# Patient Record
Sex: Male | Born: 1975 | Race: White | Hispanic: No | Marital: Single | Smoking: Current every day smoker
Health system: Southern US, Community
[De-identification: ages and names within clinical notes are randomized; demographics above are authoritative.]

## PROBLEM LIST (undated history)

## (undated) DIAGNOSIS — G709 Myoneural disorder, unspecified: Secondary | ICD-10-CM

## (undated) DIAGNOSIS — N189 Chronic kidney disease, unspecified: Secondary | ICD-10-CM

## (undated) HISTORY — PX: JOINT REPLACEMENT: SHX530

---

## 1998-09-15 ENCOUNTER — Ambulatory Visit (HOSPITAL_COMMUNITY): Admission: RE | Admit: 1998-09-15 | Discharge: 1998-09-15 | Payer: Self-pay | Admitting: Orthopedic Surgery

## 1998-09-15 ENCOUNTER — Encounter: Payer: Self-pay | Admitting: Orthopedic Surgery

## 1998-10-01 ENCOUNTER — Ambulatory Visit (HOSPITAL_BASED_OUTPATIENT_CLINIC_OR_DEPARTMENT_OTHER): Admission: RE | Admit: 1998-10-01 | Discharge: 1998-10-01 | Payer: Self-pay | Admitting: Orthopedic Surgery

## 1998-10-18 ENCOUNTER — Encounter: Admission: RE | Admit: 1998-10-18 | Discharge: 1998-11-04 | Payer: Self-pay | Admitting: Orthopedic Surgery

## 1999-11-10 ENCOUNTER — Emergency Department (HOSPITAL_COMMUNITY): Admission: EM | Admit: 1999-11-10 | Discharge: 1999-11-10 | Payer: Self-pay | Admitting: Emergency Medicine

## 1999-11-10 ENCOUNTER — Encounter: Payer: Self-pay | Admitting: Emergency Medicine

## 2000-01-29 ENCOUNTER — Emergency Department (HOSPITAL_COMMUNITY): Admission: EM | Admit: 2000-01-29 | Discharge: 2000-01-29 | Payer: Self-pay | Admitting: Emergency Medicine

## 2000-05-25 ENCOUNTER — Emergency Department (HOSPITAL_COMMUNITY): Admission: EM | Admit: 2000-05-25 | Discharge: 2000-05-25 | Payer: Self-pay | Admitting: Emergency Medicine

## 2000-11-07 ENCOUNTER — Encounter: Payer: Self-pay | Admitting: Emergency Medicine

## 2000-11-07 ENCOUNTER — Emergency Department (HOSPITAL_COMMUNITY): Admission: EM | Admit: 2000-11-07 | Discharge: 2000-11-07 | Payer: Self-pay | Admitting: Emergency Medicine

## 2001-10-11 ENCOUNTER — Emergency Department (HOSPITAL_COMMUNITY): Admission: EM | Admit: 2001-10-11 | Discharge: 2001-10-12 | Payer: Self-pay | Admitting: Emergency Medicine

## 2001-10-11 ENCOUNTER — Encounter: Payer: Self-pay | Admitting: Emergency Medicine

## 2001-11-14 ENCOUNTER — Encounter: Payer: Self-pay | Admitting: Emergency Medicine

## 2001-11-14 ENCOUNTER — Emergency Department (HOSPITAL_COMMUNITY): Admission: EM | Admit: 2001-11-14 | Discharge: 2001-11-14 | Payer: Self-pay | Admitting: Emergency Medicine

## 2003-02-26 ENCOUNTER — Emergency Department (HOSPITAL_COMMUNITY): Admission: EM | Admit: 2003-02-26 | Discharge: 2003-02-26 | Payer: Self-pay | Admitting: Emergency Medicine

## 2004-08-24 ENCOUNTER — Emergency Department (HOSPITAL_COMMUNITY): Admission: EM | Admit: 2004-08-24 | Discharge: 2004-08-24 | Payer: Self-pay | Admitting: Emergency Medicine

## 2004-09-22 ENCOUNTER — Inpatient Hospital Stay (HOSPITAL_COMMUNITY): Admission: EM | Admit: 2004-09-22 | Discharge: 2004-09-25 | Payer: Self-pay | Admitting: Emergency Medicine

## 2005-05-18 ENCOUNTER — Emergency Department (HOSPITAL_COMMUNITY): Admission: EM | Admit: 2005-05-18 | Discharge: 2005-05-18 | Payer: Self-pay | Admitting: *Deleted

## 2005-05-21 ENCOUNTER — Emergency Department (HOSPITAL_COMMUNITY): Admission: EM | Admit: 2005-05-21 | Discharge: 2005-05-21 | Payer: Self-pay | Admitting: Emergency Medicine

## 2005-05-24 ENCOUNTER — Emergency Department (HOSPITAL_COMMUNITY): Admission: EM | Admit: 2005-05-24 | Discharge: 2005-05-24 | Payer: Self-pay | Admitting: Emergency Medicine

## 2011-02-05 ENCOUNTER — Emergency Department (HOSPITAL_COMMUNITY): Payer: Self-pay

## 2011-02-05 ENCOUNTER — Emergency Department (HOSPITAL_COMMUNITY)
Admission: EM | Admit: 2011-02-05 | Discharge: 2011-02-05 | Disposition: A | Discharge status: Home / Self Care | Payer: Self-pay | Attending: Emergency Medicine | Admitting: Emergency Medicine

## 2011-02-05 DIAGNOSIS — R109 Unspecified abdominal pain: Secondary | ICD-10-CM | POA: Insufficient documentation

## 2011-02-05 DIAGNOSIS — R110 Nausea: Secondary | ICD-10-CM | POA: Insufficient documentation

## 2011-02-05 LAB — URINALYSIS, ROUTINE W REFLEX MICROSCOPIC
Bilirubin Urine: NEGATIVE
Glucose, UA: NEGATIVE mg/dL
Hgb urine dipstick: NEGATIVE
Ketones, ur: 15 mg/dL — AB
Leukocytes, UA: NEGATIVE
Nitrite: NEGATIVE
Protein, ur: NEGATIVE mg/dL
Urobilinogen, UA: 1 mg/dL (ref 0.0–1.0)

## 2011-03-20 ENCOUNTER — Emergency Department (HOSPITAL_COMMUNITY): Payer: Self-pay

## 2011-03-20 ENCOUNTER — Emergency Department (HOSPITAL_COMMUNITY)
Admission: EM | Admit: 2011-03-20 | Discharge: 2011-03-20 | Disposition: A | Discharge status: Home / Self Care | Payer: Self-pay | Attending: Emergency Medicine | Admitting: Emergency Medicine

## 2011-03-20 DIAGNOSIS — M545 Low back pain, unspecified: Secondary | ICD-10-CM | POA: Insufficient documentation

## 2011-03-20 DIAGNOSIS — R6884 Jaw pain: Secondary | ICD-10-CM | POA: Insufficient documentation

## 2011-03-20 DIAGNOSIS — M542 Cervicalgia: Secondary | ICD-10-CM | POA: Insufficient documentation

## 2011-03-20 DIAGNOSIS — R112 Nausea with vomiting, unspecified: Secondary | ICD-10-CM | POA: Insufficient documentation

## 2011-03-20 DIAGNOSIS — IMO0002 Reserved for concepts with insufficient information to code with codable children: Secondary | ICD-10-CM | POA: Insufficient documentation

## 2011-03-20 DIAGNOSIS — S139XXA Sprain of joints and ligaments of unspecified parts of neck, initial encounter: Secondary | ICD-10-CM | POA: Insufficient documentation

## 2011-03-20 DIAGNOSIS — R51 Headache: Secondary | ICD-10-CM | POA: Insufficient documentation

## 2011-03-20 DIAGNOSIS — S0010XA Contusion of unspecified eyelid and periocular area, initial encounter: Secondary | ICD-10-CM | POA: Insufficient documentation

## 2011-03-20 DIAGNOSIS — S0990XA Unspecified injury of head, initial encounter: Secondary | ICD-10-CM | POA: Insufficient documentation

## 2011-03-20 DIAGNOSIS — Z23 Encounter for immunization: Secondary | ICD-10-CM | POA: Insufficient documentation

## 2012-02-12 ENCOUNTER — Emergency Department (HOSPITAL_BASED_OUTPATIENT_CLINIC_OR_DEPARTMENT_OTHER): Payer: Self-pay

## 2012-02-12 ENCOUNTER — Inpatient Hospital Stay (HOSPITAL_BASED_OUTPATIENT_CLINIC_OR_DEPARTMENT_OTHER)
Admission: EM | Admit: 2012-02-12 | Discharge: 2012-02-13 | DRG: 683 | Disposition: A | Discharge status: Home / Self Care | Payer: Self-pay | Attending: Internal Medicine | Admitting: Internal Medicine

## 2012-02-12 ENCOUNTER — Encounter (HOSPITAL_BASED_OUTPATIENT_CLINIC_OR_DEPARTMENT_OTHER): Payer: Self-pay | Admitting: Emergency Medicine

## 2012-02-12 DIAGNOSIS — R7402 Elevation of levels of lactic acid dehydrogenase (LDH): Secondary | ICD-10-CM | POA: Diagnosis present

## 2012-02-12 DIAGNOSIS — M6282 Rhabdomyolysis: Secondary | ICD-10-CM | POA: Diagnosis present

## 2012-02-12 DIAGNOSIS — N179 Acute kidney failure, unspecified: Principal | ICD-10-CM | POA: Diagnosis present

## 2012-02-12 DIAGNOSIS — E871 Hypo-osmolality and hyponatremia: Secondary | ICD-10-CM | POA: Diagnosis present

## 2012-02-12 DIAGNOSIS — E872 Acidosis: Secondary | ICD-10-CM

## 2012-02-12 DIAGNOSIS — R251 Tremor, unspecified: Secondary | ICD-10-CM

## 2012-02-12 DIAGNOSIS — R259 Unspecified abnormal involuntary movements: Secondary | ICD-10-CM | POA: Diagnosis present

## 2012-02-12 DIAGNOSIS — R7401 Elevation of levels of liver transaminase levels: Secondary | ICD-10-CM | POA: Diagnosis present

## 2012-02-12 HISTORY — DX: Myoneural disorder, unspecified: G70.9

## 2012-02-12 HISTORY — DX: Chronic kidney disease, unspecified: N18.9

## 2012-02-12 LAB — URINE MICROSCOPIC-ADD ON

## 2012-02-12 LAB — CBC WITH DIFFERENTIAL/PLATELET
Basophils Absolute: 0 10*3/uL (ref 0.0–0.1)
Basophils Relative: 0 % (ref 0–1)
Eosinophils Relative: 0 % (ref 0–5)
HCT: 43.6 % (ref 39.0–52.0)
Hemoglobin: 16.4 g/dL (ref 13.0–17.0)
Lymphs Abs: 1 10*3/uL (ref 0.7–4.0)
MCH: 32.7 pg (ref 26.0–34.0)
MCV: 86.9 fL (ref 78.0–100.0)
Monocytes Absolute: 1.1 10*3/uL — ABNORMAL HIGH (ref 0.1–1.0)
Monocytes Relative: 11 % (ref 3–12)
Neutro Abs: 7.9 10*3/uL — ABNORMAL HIGH (ref 1.7–7.7)
Neutrophils Relative %: 79 % — ABNORMAL HIGH (ref 43–77)
Platelets: 369 10*3/uL (ref 150–400)
RBC: 5.02 MIL/uL (ref 4.22–5.81)
RDW: 13.7 % (ref 11.5–15.5)
WBC: 10 10*3/uL (ref 4.0–10.5)

## 2012-02-12 LAB — COMPREHENSIVE METABOLIC PANEL
ALT: 30 U/L (ref 0–53)
AST: 28 U/L (ref 0–37)
Alkaline Phosphatase: 69 U/L (ref 39–117)
CO2: 19 mEq/L (ref 19–32)
Chloride: 79 mEq/L — ABNORMAL LOW (ref 96–112)
Creatinine, Ser: 7.1 mg/dL — ABNORMAL HIGH (ref 0.50–1.35)
GFR calc Af Amer: 10 mL/min — ABNORMAL LOW (ref >90)
GFR calc non Af Amer: 9 mL/min — ABNORMAL LOW (ref >90)
Glucose, Bld: 125 mg/dL — ABNORMAL HIGH (ref 70–99)
Potassium: 4 mEq/L (ref 3.5–5.1)
Sodium: 125 mEq/L — ABNORMAL LOW (ref 135–145)
Total Bilirubin: 0.5 mg/dL (ref 0.3–1.2)

## 2012-02-12 LAB — CREATININE, URINE, RANDOM: Creatinine, Urine: 182.45 mg/dL

## 2012-02-12 LAB — URINALYSIS, ROUTINE W REFLEX MICROSCOPIC
Bilirubin Urine: NEGATIVE
Glucose, UA: 250 mg/dL — AB
Ketones, ur: NEGATIVE mg/dL
Nitrite: NEGATIVE
Protein, ur: 30 mg/dL — AB
Specific Gravity, Urine: 1.017 (ref 1.005–1.030)

## 2012-02-12 LAB — MAGNESIUM: Magnesium: 2.4 mg/dL (ref 1.5–2.5)

## 2012-02-12 LAB — PHOSPHORUS: Phosphorus: 6.7 mg/dL — ABNORMAL HIGH (ref 2.3–4.6)

## 2012-02-12 LAB — LIPASE, BLOOD: Lipase: 101 U/L — ABNORMAL HIGH (ref 11–59)

## 2012-02-12 LAB — LACTIC ACID, PLASMA: Lactic Acid, Venous: 1 mmol/L (ref 0.5–2.2)

## 2012-02-12 MED ORDER — ACETAMINOPHEN 325 MG PO TABS: 650.0000 mg | ORAL_TABLET | Freq: Four times a day (QID) | ORAL | Status: DC | PRN

## 2012-02-12 MED ORDER — ONDANSETRON HCL 4 MG PO TABS: 4.0000 mg | ORAL_TABLET | Freq: Four times a day (QID) | ORAL | Status: DC | PRN

## 2012-02-12 MED ORDER — HYDROMORPHONE HCL PF 1 MG/ML IJ SOLN
1.0000 mg | Freq: Once | INTRAMUSCULAR | Status: AC
  Administered 2012-02-12: 1 mg via INTRAVENOUS

## 2012-02-12 MED ORDER — SODIUM BICARBONATE 8.4 % IV SOLN
INTRAVENOUS | Status: DC
  Administered 2012-02-12 – 2012-02-13 (×2): via INTRAVENOUS
  Filled 2012-02-12 (×4): qty 150

## 2012-02-12 MED ORDER — SODIUM CHLORIDE 0.9 % IV BOLUS (SEPSIS)
1000.0000 mL | Freq: Once | INTRAVENOUS | Status: AC
  Administered 2012-02-12 (×2): 1000 mL via INTRAVENOUS

## 2012-02-12 MED ORDER — KETOROLAC TROMETHAMINE 30 MG/ML IJ SOLN
30.0000 mg | Freq: Once | INTRAMUSCULAR | Status: AC
Start: 2012-02-12 — End: 2012-02-12
  Administered 2012-02-12: 30 mg via INTRAVENOUS

## 2012-02-12 MED ORDER — ACETAMINOPHEN 650 MG RE SUPP: 650.0000 mg | Freq: Four times a day (QID) | RECTAL | Status: DC | PRN

## 2012-02-12 MED ORDER — ONDANSETRON HCL 4 MG/2ML IJ SOLN
4.0000 mg | Freq: Once | INTRAMUSCULAR | Status: AC
  Administered 2012-02-12: 4 mg via INTRAVENOUS

## 2012-02-12 MED ORDER — VITAMIN B-1 100 MG PO TABS
100.0000 mg | ORAL_TABLET | Freq: Every day | ORAL | Status: DC
  Administered 2012-02-12 – 2012-02-13 (×2): 100 mg via ORAL
  Filled 2012-02-12 (×2): qty 1

## 2012-02-12 MED ORDER — NICOTINE 14 MG/24HR TD PT24
14.0000 mg | MEDICATED_PATCH | Freq: Every day | TRANSDERMAL | Status: DC
  Administered 2012-02-12 – 2012-02-13 (×2): 14 mg via TRANSDERMAL
  Filled 2012-02-12 (×2): qty 1

## 2012-02-12 MED ORDER — SODIUM CHLORIDE 0.9 % IJ SOLN
3.0000 mL | Freq: Two times a day (BID) | INTRAMUSCULAR | Status: DC
  Administered 2012-02-12: 3 mL via INTRAVENOUS

## 2012-02-12 MED ORDER — SODIUM CHLORIDE 0.9 % IV BOLUS (SEPSIS)
500.0000 mL | Freq: Once | INTRAVENOUS | Status: AC
  Administered 2012-02-12: 20:00:00 via INTRAVENOUS

## 2012-02-12 MED ORDER — PANTOPRAZOLE SODIUM 40 MG IV SOLR
40.0000 mg | Freq: Two times a day (BID) | INTRAVENOUS | Status: DC
  Administered 2012-02-12 – 2012-02-13 (×2): 40 mg via INTRAVENOUS
  Filled 2012-02-12 (×3): qty 40

## 2012-02-12 MED ORDER — ONDANSETRON HCL 4 MG/2ML IJ SOLN: 4.0000 mg | Freq: Four times a day (QID) | INTRAMUSCULAR | Status: DC | PRN

## 2012-02-12 MED ORDER — FOLIC ACID 1 MG PO TABS
1.0000 mg | ORAL_TABLET | Freq: Every day | ORAL | Status: DC
  Administered 2012-02-12 – 2012-02-13 (×2): 1 mg via ORAL
  Filled 2012-02-12 (×2): qty 1

## 2012-02-12 MED ORDER — SODIUM CHLORIDE 0.9 % IV SOLN: INTRAVENOUS | Status: DC

## 2012-02-12 MED ORDER — DOCUSATE SODIUM 100 MG PO CAPS
100.0000 mg | ORAL_CAPSULE | Freq: Two times a day (BID) | ORAL | Status: DC
  Administered 2012-02-12 – 2012-02-13 (×2): 100 mg via ORAL
  Filled 2012-02-12 (×3): qty 1

## 2012-02-12 MED ORDER — ONDANSETRON HCL 4 MG/2ML IJ SOLN: 4.0000 mg | Freq: Three times a day (TID) | INTRAMUSCULAR | Status: DC | PRN

## 2012-02-12 MED ORDER — HYDROMORPHONE HCL PF 1 MG/ML IJ SOLN
1.0000 mg | INTRAMUSCULAR | Status: DC | PRN
  Administered 2012-02-12 – 2012-02-13 (×5): 1 mg via INTRAVENOUS
  Filled 2012-02-12 (×5): qty 1

## 2012-02-12 MED ORDER — LORAZEPAM 0.5 MG PO TABS
0.5000 mg | ORAL_TABLET | Freq: Three times a day (TID) | ORAL | Status: DC | PRN
  Administered 2012-02-12 – 2012-02-13 (×2): 0.5 mg via ORAL
  Filled 2012-02-12 (×2): qty 1

## 2012-02-12 MED ORDER — HYDROMORPHONE HCL PF 1 MG/ML IJ SOLN: 1.0000 mg | INTRAMUSCULAR | Status: DC | PRN

## 2012-02-12 MED ORDER — HYDROCODONE-ACETAMINOPHEN 5-325 MG PO TABS
1.0000 | ORAL_TABLET | ORAL | Status: DC | PRN
  Administered 2012-02-13: 2 via ORAL
  Filled 2012-02-12: qty 2

## 2012-02-12 NOTE — ED Notes (Signed)
Pt is unable to void at present time. 

## 2012-02-12 NOTE — ED Notes (Signed)
[facility] has been called for transport to room 5506 at [facility].

## 2012-02-12 NOTE — ED Notes (Signed)
Had [facility] to repage

## 2012-02-12 NOTE — ED Provider Notes (Signed)
History     CSN: [id number]  Arrival date & time 02/12/12  1248   First MD Initiated Contact with Patient 02/12/12 1248      Chief Complaint  Patient presents with  . Weakness  . Nausea     HPI The patient presents with one week of complaints.  He notes that prior to one week ago he was in his usual state of health aside from a recent episode of viral illness.  One week ago the patient was working outside, in the sun, and a particularly hot day.  He notes that following a that he has had persistent fatigue, generalized discomfort, and decreased appetite.  Over the past 2 days the symptoms have become worse, with new nausea as well.  The patient notes no clear alleviating or exacerbating factors, with no improvement following days of rest.  He denies any concurrent disorientation, discoordination, but does say he is lightheaded. He denies any focal chest pain, dyspnea. History reviewed. No pertinent past medical history.  History reviewed. No pertinent past surgical history.  No family history on file.  History  Substance Use Topics  . Smoking status: Current Everyday Smoker -- 1.0 packs/day  . Smokeless tobacco: Not on file  . Alcohol Use: Yes      Review of Systems  Constitutional:       Per HPI, otherwise negative  HENT:       Per HPI, otherwise negative  Eyes: Negative.   Respiratory:       Per HPI, otherwise negative  Cardiovascular:       Per HPI, otherwise negative  Gastrointestinal: Positive for nausea. Negative for vomiting.  Genitourinary: Negative.   Musculoskeletal:       Per HPI, otherwise negative  Skin: Negative.   Neurological: Positive for light-headedness. Negative for syncope.    Allergies  Phenergan  Home Medications  No current outpatient prescriptions on file.  BP 105/77  Pulse 109  Temp 98.4 F (36.9 C) (Oral)  Ht 5\' 10"  (1.778 m)  Wt 165 lb (74.844 kg)  BMI 23.68 kg/m2  SpO2 99%  Physical Exam  Nursing note and vitals  reviewed. Constitutional: He is oriented to person, place, and time. He appears well-developed. No distress.  HENT:  Head: Normocephalic and atraumatic.  Eyes: Conjunctivae and EOM are normal.  Cardiovascular: Regular rhythm.  Tachycardia present.   Pulmonary/Chest: Effort normal. No stridor. No respiratory distress.  Abdominal: He exhibits no distension.  Musculoskeletal: He exhibits no edema.  Neurological: He is alert and oriented to person, place, and time.  Skin: Skin is warm and dry.  Psychiatric: He has a normal mood and affect.    ED Course  Procedures (including critical care time)   Labs Reviewed  LACTIC ACID, PLASMA  CBC WITH DIFFERENTIAL  LIPASE, BLOOD  URINALYSIS, ROUTINE W REFLEX MICROSCOPIC  COMPREHENSIVE METABOLIC PANEL  CK   No results found.   No diagnosis found.   Pulse ox 99% room air normal  3:38 PM The patient was informed of all results.  He notes that he feels slightly better with IV fluids. I discussed the case with both nephrology and the hospitalist service MDM  This previously well 34 old male presents with new generalized complaints, nausea.  On the patient is listless appearing, though he is mentating appropriately he appears unwell.  The patient is tachycardic.  Given the patient's description of a week of symptoms there's concern for acute viral episode versus dehydration, versus renal issues.  The  patient's labs are notable for dentures and creatinine of 7.  The patient has other notable abnormalities.  Given these findings, the patient was transferred to [facility] for additional evaluation and management.  The patient continued to receive IV fluids, analgesics at [facility].  He was transferred in stable but guarded condition.    Gerhard Munch, MD 02/13/12 1218
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
## 2012-02-12 NOTE — ED Notes (Signed)
Via [facility]--spoke with Feliz Beam to page both Nephrology and Hospitalist

## 2012-02-13 ENCOUNTER — Encounter (HOSPITAL_COMMUNITY): Payer: Self-pay | Admitting: *Deleted

## 2012-02-13 ENCOUNTER — Inpatient Hospital Stay (HOSPITAL_COMMUNITY): Payer: Self-pay

## 2012-02-13 DIAGNOSIS — M6282 Rhabdomyolysis: Secondary | ICD-10-CM

## 2012-02-13 DIAGNOSIS — E872 Acidosis: Secondary | ICD-10-CM | POA: Diagnosis present

## 2012-02-13 DIAGNOSIS — N179 Acute kidney failure, unspecified: Principal | ICD-10-CM

## 2012-02-13 DIAGNOSIS — E871 Hypo-osmolality and hyponatremia: Secondary | ICD-10-CM

## 2012-02-13 LAB — RENAL FUNCTION PANEL
Albumin: 3.5 g/dL (ref 3.5–5.2)
CO2: 27 mEq/L (ref 19–32)
Chloride: 88 mEq/L — ABNORMAL LOW (ref 96–112)
GFR calc Af Amer: 32 mL/min — ABNORMAL LOW (ref >90)
GFR calc non Af Amer: 27 mL/min — ABNORMAL LOW (ref >90)
Potassium: 2.7 mEq/L — CL (ref 3.5–5.1)
Sodium: 130 mEq/L — ABNORMAL LOW (ref 135–145)

## 2012-02-13 LAB — BASIC METABOLIC PANEL
CO2: 26 mEq/L (ref 19–32)
Glucose, Bld: 133 mg/dL — ABNORMAL HIGH (ref 70–99)
Potassium: 4.7 mEq/L (ref 3.5–5.1)
Sodium: 136 mEq/L (ref 135–145)

## 2012-02-13 LAB — SODIUM, URINE, RANDOM: Sodium, Ur: <10 mEq/L

## 2012-02-13 LAB — CBC
MCV: 89.2 fL (ref 78.0–100.0)
Platelets: 265 10*3/uL (ref 150–400)
RBC: 3.9 MIL/uL — ABNORMAL LOW (ref 4.22–5.81)
WBC: 5.2 10*3/uL (ref 4.0–10.5)

## 2012-02-13 LAB — OSMOLALITY, URINE: Osmolality, Ur: 358 mOsm/kg — ABNORMAL LOW (ref 390–1090)

## 2012-02-13 LAB — HIV ANTIBODY (ROUTINE TESTING W REFLEX): HIV: NONREACTIVE

## 2012-02-13 LAB — TSH: TSH: 0.781 u[IU]/mL (ref 0.350–4.500)

## 2012-02-13 MED ORDER — SODIUM CHLORIDE 0.9 % IV SOLN
INTRAVENOUS | Status: DC
  Administered 2012-02-13: 125 mL via INTRAVENOUS

## 2012-02-13 MED ORDER — HYDROCODONE-ACETAMINOPHEN 5-325 MG PO TABS: 1.0000 | ORAL_TABLET | ORAL | Status: AC | PRN

## 2012-02-13 MED ORDER — NICOTINE 21 MG/24HR TD PT24: 1.0000 | MEDICATED_PATCH | TRANSDERMAL | Status: AC

## 2012-02-13 MED ORDER — SODIUM CHLORIDE 0.9 % IV BOLUS (SEPSIS)
500.0000 mL | Freq: Once | INTRAVENOUS | Status: AC
  Administered 2012-02-13: 500 mL via INTRAVENOUS

## 2012-02-13 MED ORDER — POTASSIUM CHLORIDE 10 MEQ/100ML IV SOLN
10.0000 meq | INTRAVENOUS | Status: DC
  Filled 2012-02-13 (×4): qty 100

## 2012-02-13 MED ORDER — POTASSIUM CHLORIDE CRYS ER 20 MEQ PO TBCR
40.0000 meq | EXTENDED_RELEASE_TABLET | Freq: Three times a day (TID) | ORAL | Status: DC
  Administered 2012-02-13 (×2): 40 meq via ORAL
  Filled 2012-02-13 (×2): qty 2

## 2012-02-13 MED ORDER — POTASSIUM CHLORIDE 10 MEQ/100ML IV SOLN
10.0000 meq | INTRAVENOUS | Status: AC
  Administered 2012-02-13 (×4): 10 meq via INTRAVENOUS
  Filled 2012-02-13 (×4): qty 100

## 2012-02-13 NOTE — Progress Notes (Signed)
Harold Whitney to be D/C'd Home per MD order.  Discussed with the patient and all questions fully answered.   Romario, Tith  Home Medication Instructions ZOX:[id number]   Printed on:02/13/12 1800  Medication Information                    HYDROcodone-acetaminophen (NORCO/VICODIN) 5-325 MG per tablet Take 1-2 tablets by mouth every 4 (four) hours as needed.           nicotine (NICODERM CQ - DOSED IN MG/24 HOURS) 21 mg/24hr patch Place 1 patch onto the skin daily.             VVS, Skin clean, dry and intact without evidence of skin break down, no evidence of skin tears noted. IV catheter discontinued intact. Site without signs and symptoms of complications. Dressing and pressure applied.  An After Visit Summary was printed and given to the patient. Follow up appointments , new prescriptions and medication administration times given. Note given for work. Pt education handouts included hypokalemia, dehydration and rehydration and smoking cessation handouts and information on the Quit line. Patient  D/C home via private auto.  Cindra Eves, RN 02/13/2012 6:00 PM

## 2012-02-13 NOTE — Discharge Summary (Signed)
Physician Discharge Summary  Harold Whitney NFA:[id number] DOB: Apr 04, 1976 DOA: 02/12/2012  PCP: No primary provider on file.  Admit date: 02/12/2012 Discharge date: 02/13/2012  Discharge Diagnoses:  Active Problems:  Acute renal failure  Occasional tremors  Hyponatremia  Rhabdomyolysis   Discharge Condition: Stable for discharge  Disposition:  Followup with his primary care doctor and to 4 weeks. Next week we'll try to get a basic metabolic panel sodium is kidneys are doing.  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 He relates that 3 weeks ago he was feeling sick, he had fever 101, he slept all day. That resolved. Then 1 week ago, he was out site working, in the sun, he felt tired, fatigue. He had lower extremities and abdominal cramps that started 2 days prior to admission. He also relates multiple episodes of vomiting fluids content day prior to admission. He has been taking ibuprofen for muscle pain and cramps for last couple of days. 2 tablets every 4 hour. He had 1 episode of watery BM 3 days prior to admission. He drinks alcohol but he can stop drinking without problems. He has not been able to urinate since day prior to admission.   Hospital Course:  Acute renal failure: His acute renal failure is much likely multifactorial secondary to decreased intravascular volume secondary to dehydration, and said use and mild rhabdomyolysis. He was admitted to the hospital was started on IV fluids and bicarbonate drip. His creatinine dramatically changed when it initially when he came into the hospital it was 7 at the time of discharge was 2.1. So his IV fluids were changed to normal saline. And he would continue and has been advised to continue to drink plenty of fluid at home. Avoid NSAIDs.  Hyponatremia -Days mostly  secondary to decreased intravascular volume does resolve with IV fluids.  Rhabdomyolysis -Most likely secondary to dehydration. His peak CK was 591, after hydration came down to 300s. He continued oral fluid repletion as an outpatient.   Elevated liver enzymes: This most likely secondary to his rhabdomyolysis. He has no jaundice his bilirubin is on normal limits and his alkaline phosphatase is also within normal limits.  Discharge Exam: Filed Vitals:   02/13/12 0542  BP: 124/81  Pulse: 71  Temp: 97.8 F (36.6 C)  Resp: 20   Filed Vitals:   02/12/12 1612 02/12/12 1700 02/12/12 2201 02/13/12 0542  BP:  121/70 134/77 124/81  Pulse:  87 76 71  Temp:  98 F (36.7 C) 98.5 F (36.9 C) 97.8 F (36.6 C)  TempSrc:  Oral Oral Oral  Resp: 20 20 18 20   Height:      Weight:    75.2 kg (165 lb 12.6 oz)  SpO2:  97% 95% 96%   General: Awake alert and oriented x3 Cardiovascular: Regular in rhythm with positive S1 and S2 Respiratory: Good air movement and clear to auscultate  Discharge Instructions  Discharge Orders    Future Orders Please Complete By Expires   Diet - low sodium heart healthy      Increase activity slowly        Medication List    Notice       You have not been prescribed any medications.               The results of significant diagnostics from this  hospitalization (including imaging, microbiology, ancillary and laboratory) are listed below for reference.    Significant Diagnostic Studies: Dg Chest 2 View  02/12/2012  *RADIOLOGY REPORT*  Clinical Data: Discomfort. Weakness.  CHEST - 2 VIEW  Comparison: 05/21/2005  Findings: The heart size and mediastinal contours are within normal limits.  Both lungs are clear.  The visualized skeletal structures are unremarkable.  IMPRESSION: Negative exam.  Original Report Authenticated By: Rosealee Albee, M.D.    Microbiology: No results found for this or any previous visit (from the past 240 hour(s)).    Labs: Basic Metabolic Panel:  Lab 02/13/12 1610 02/12/12 2008 02/12/12 1400  NA 130* -- 125*  K 2.7* -- 4.0  CL 88* -- 79*  CO2 27 -- 19  GLUCOSE 121* -- 125*  BUN 44* -- 63*  CREATININE 2.82* -- 7.10*  CALCIUM 8.1* -- 9.7  MG -- 2.4 --  PHOS 4.2 6.7* --   Liver Function Tests:  Lab 02/13/12 0520 02/12/12 1400  AST -- 28  ALT -- 30  ALKPHOS -- 69  BILITOT -- 0.5  PROT -- 9.2*  ALBUMIN 3.5 5.4*    Lab 02/13/12 0520 02/12/12 1400  LIPASE 61* 101*  AMYLASE -- --   No results found for this basename: AMMONIA:5 in the last 168 hours CBC:  Lab 02/13/12 0520 02/12/12 1400  WBC 5.2 10.0  NEUTROABS -- 7.9*  HGB 12.5* 16.4  HCT 34.8* 43.6  MCV 89.2 86.9  PLT 265 369   Cardiac Enzymes:  Lab 02/13/12 0520 02/12/12 1400  CKTOTAL 383* 591*  CKMB -- --  CKMBINDEX -- --  TROPONINI -- --   BNP: No components found with this basename: POCBNP:5 CBG: No results found for this basename: GLUCAP:5 in the last 168 hours  Time coordinating discharge: Greater than 35 minutes  Signed:  Marinda Elk  [facility] 02/13/2012, 10:23 AM

## 2012-02-13 NOTE — Progress Notes (Signed)
CRITICAL VALUE ALERT  Critical value received: Potassium 2.7  Date of notification:  02/13/12  Time of notification:  0645  Critical value read back:yes  Nurse who received alert:  Nelda Marseille, RN  MD notified (1st page):Callahan, NP  Time of first page: [phone]  MD notified (2nd page):  Time of second page:  Responding MD:  Claiborne Billings, NP  Time MD responded:  [phone] will put in order for potassium IV runs

## 2012-02-15 MED FILL — Hydromorphone HCl Preservative Free (PF) Inj 2 MG/ML: INTRAMUSCULAR | Qty: 1 | Status: AC

## 2012-02-16 LAB — PROTEIN ELECTROPHORESIS, SERUM
Alpha-2-Globulin: 12.1 % — ABNORMAL HIGH (ref 7.1–11.8)
Beta 2: 3.8 % (ref 3.2–6.5)
Beta Globulin: 6.1 % (ref 4.7–7.2)
M-Spike, %: NOT DETECTED g/dL

## 2012-02-16 LAB — UIFE/LIGHT CHAINS/TP QN, 24-HR UR
Albumin, U: DETECTED
Alpha 1, Urine: DETECTED — AB
Alpha 2, Urine: DETECTED — AB
Free Kappa Lt Chains,Ur: 48.3 mg/dL — ABNORMAL HIGH (ref 0.14–2.42)
Free Kappa/Lambda Ratio: 4.47 ratio (ref 2.04–10.37)
Gamma Globulin, Urine: DETECTED — AB

## 2012-03-21 ENCOUNTER — Encounter (HOSPITAL_COMMUNITY): Payer: Self-pay | Admitting: Family Medicine

## 2012-03-21 ENCOUNTER — Emergency Department (HOSPITAL_COMMUNITY)
Admission: EM | Admit: 2012-03-21 | Discharge: 2012-03-21 | Disposition: A | Discharge status: Home Health | Payer: Self-pay | Attending: Emergency Medicine | Admitting: Emergency Medicine

## 2012-03-21 DIAGNOSIS — N189 Chronic kidney disease, unspecified: Secondary | ICD-10-CM | POA: Insufficient documentation

## 2012-03-21 DIAGNOSIS — G8918 Other acute postprocedural pain: Secondary | ICD-10-CM | POA: Insufficient documentation

## 2012-03-21 DIAGNOSIS — F17200 Nicotine dependence, unspecified, uncomplicated: Secondary | ICD-10-CM | POA: Insufficient documentation

## 2012-03-21 MED ORDER — OXYCODONE-ACETAMINOPHEN 5-325 MG PO TABS
2.0000 | ORAL_TABLET | Freq: Once | ORAL | Status: AC
  Administered 2012-03-21: 2 via ORAL
  Filled 2012-03-21: qty 2

## 2012-03-21 MED ORDER — OXYCODONE-ACETAMINOPHEN 5-325 MG PO TABS: 1.0000 | ORAL_TABLET | ORAL | Status: AC | PRN

## 2012-03-21 NOTE — ED Provider Notes (Signed)
History/physical exam/procedure(s) were performed by non-physician practitioner and as supervising physician I was immediately available for consultation/collaboration. I have reviewed all notes and am in agreement with care and plan.   Hilario Quarry, MD 03/21/12 2004
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
## 2013-08-26 ENCOUNTER — Emergency Department (HOSPITAL_COMMUNITY): Payer: Self-pay

## 2013-08-26 ENCOUNTER — Emergency Department (HOSPITAL_COMMUNITY)
Admission: EM | Admit: 2013-08-26 | Discharge: 2013-08-26 | Disposition: A | Discharge status: Home / Self Care | Payer: Self-pay | Attending: Emergency Medicine | Admitting: Emergency Medicine

## 2013-08-26 DIAGNOSIS — N189 Chronic kidney disease, unspecified: Secondary | ICD-10-CM | POA: Insufficient documentation

## 2013-08-26 DIAGNOSIS — M549 Dorsalgia, unspecified: Secondary | ICD-10-CM

## 2013-08-26 DIAGNOSIS — Z8669 Personal history of other diseases of the nervous system and sense organs: Secondary | ICD-10-CM | POA: Insufficient documentation

## 2013-08-26 DIAGNOSIS — K5289 Other specified noninfective gastroenteritis and colitis: Secondary | ICD-10-CM | POA: Insufficient documentation

## 2013-08-26 DIAGNOSIS — F17200 Nicotine dependence, unspecified, uncomplicated: Secondary | ICD-10-CM | POA: Insufficient documentation

## 2013-08-26 DIAGNOSIS — K219 Gastro-esophageal reflux disease without esophagitis: Secondary | ICD-10-CM

## 2013-08-26 DIAGNOSIS — K529 Noninfective gastroenteritis and colitis, unspecified: Secondary | ICD-10-CM

## 2013-08-26 DIAGNOSIS — R630 Anorexia: Secondary | ICD-10-CM | POA: Insufficient documentation

## 2013-08-26 DIAGNOSIS — R197 Diarrhea, unspecified: Secondary | ICD-10-CM

## 2013-08-26 LAB — CBC WITH DIFFERENTIAL/PLATELET
BASOS ABS: 0 10*3/uL (ref 0.0–0.1)
BASOS PCT: 0 % (ref 0–1)
Eosinophils Absolute: 0.2 10*3/uL (ref 0.0–0.7)
Eosinophils Relative: 3 % (ref 0–5)
HEMATOCRIT: 37.2 % — AB (ref 39.0–52.0)
Hemoglobin: 13.1 g/dL (ref 13.0–17.0)
Lymphocytes Relative: 25 % (ref 12–46)
Lymphs Abs: 1.6 10*3/uL (ref 0.7–4.0)
MCH: 32.1 pg (ref 26.0–34.0)
MCHC: 35.2 g/dL (ref 30.0–36.0)
MCV: 91.2 fL (ref 78.0–100.0)
MONO ABS: 0.4 10*3/uL (ref 0.1–1.0)
Monocytes Relative: 7 % (ref 3–12)
NEUTROS ABS: 4.2 10*3/uL (ref 1.7–7.7)
Neutrophils Relative %: 66 % (ref 43–77)
Platelets: 275 10*3/uL (ref 150–400)
RBC: 4.08 MIL/uL — ABNORMAL LOW (ref 4.22–5.81)
RDW: 12.7 % (ref 11.5–15.5)
WBC: 6.4 10*3/uL (ref 4.0–10.5)

## 2013-08-26 LAB — URINALYSIS, ROUTINE W REFLEX MICROSCOPIC
Bilirubin Urine: NEGATIVE
GLUCOSE, UA: NEGATIVE mg/dL
Ketones, ur: NEGATIVE mg/dL
Leukocytes, UA: NEGATIVE
Nitrite: NEGATIVE
PH: 5.5 (ref 5.0–8.0)
Protein, ur: NEGATIVE mg/dL
SPECIFIC GRAVITY, URINE: 1.026 (ref 1.005–1.030)
Urobilinogen, UA: 0.2 mg/dL (ref 0.0–1.0)

## 2013-08-26 LAB — COMPREHENSIVE METABOLIC PANEL
ALBUMIN: 3.8 g/dL (ref 3.5–5.2)
ALT: 12 U/L (ref 0–53)
AST: 17 U/L (ref 0–37)
Alkaline Phosphatase: 71 U/L (ref 39–117)
BUN: 13 mg/dL (ref 6–23)
CALCIUM: 8.9 mg/dL (ref 8.4–10.5)
CHLORIDE: 101 meq/L (ref 96–112)
CO2: 27 mEq/L (ref 19–32)
CREATININE: 0.91 mg/dL (ref 0.50–1.35)
GFR calc Af Amer: >90 mL/min (ref >90)
GFR calc non Af Amer: >90 mL/min (ref >90)
Glucose, Bld: 103 mg/dL — ABNORMAL HIGH (ref 70–99)
Potassium: 3.8 mEq/L (ref 3.7–5.3)
Sodium: 142 mEq/L (ref 137–147)
Total Bilirubin: 0.3 mg/dL (ref 0.3–1.2)
Total Protein: 6.7 g/dL (ref 6.0–8.3)

## 2013-08-26 LAB — URINE MICROSCOPIC-ADD ON

## 2013-08-26 LAB — LIPASE, BLOOD: LIPASE: 29 U/L (ref 11–59)

## 2013-08-26 MED ORDER — SODIUM CHLORIDE 0.9 % IV BOLUS (SEPSIS)
1000.0000 mL | Freq: Once | INTRAVENOUS | Status: AC
  Administered 2013-08-26: 1000 mL via INTRAVENOUS

## 2013-08-26 MED ORDER — ONDANSETRON 4 MG PO TBDP: 4.0000 mg | ORAL_TABLET | Freq: Three times a day (TID) | ORAL | Status: DC | PRN

## 2013-08-26 MED ORDER — MORPHINE SULFATE 4 MG/ML IJ SOLN
4.0000 mg | Freq: Once | INTRAMUSCULAR | Status: AC
  Administered 2013-08-26: 4 mg via INTRAVENOUS
  Filled 2013-08-26: qty 1

## 2013-08-26 MED ORDER — IOHEXOL 300 MG/ML  SOLN
25.0000 mL | INTRAMUSCULAR | Status: DC | PRN
  Administered 2013-08-26: 25 mL via ORAL

## 2013-08-26 MED ORDER — OMEPRAZOLE 20 MG PO CPDR: 20.0000 mg | DELAYED_RELEASE_CAPSULE | Freq: Every day | ORAL | Status: DC

## 2013-08-26 MED ORDER — METRONIDAZOLE 500 MG PO TABS: 500.0000 mg | ORAL_TABLET | Freq: Three times a day (TID) | ORAL | Status: DC

## 2013-08-26 MED ORDER — IOHEXOL 300 MG/ML  SOLN
100.0000 mL | Freq: Once | INTRAMUSCULAR | Status: AC | PRN
  Administered 2013-08-26: 100 mL via INTRAVENOUS

## 2013-08-26 MED ORDER — ONDANSETRON HCL 4 MG/2ML IJ SOLN
4.0000 mg | Freq: Once | INTRAMUSCULAR | Status: AC
  Administered 2013-08-26: 4 mg via INTRAVENOUS
  Filled 2013-08-26: qty 2

## 2013-08-26 MED ORDER — OXYCODONE-ACETAMINOPHEN 5-325 MG PO TABS: 1.0000 | ORAL_TABLET | Freq: Four times a day (QID) | ORAL | Status: DC | PRN

## 2013-08-26 NOTE — ED Provider Notes (Signed)
Filed Vitals:   08/26/13 1715  BP: 156/99  Pulse: 66  Temp:   Resp: 18   Medications  iohexol (OMNIPAQUE) 300 MG/ML solution 25 mL (25 mLs Oral Contrast Given 08/26/13 1515)  sodium chloride 0.9 % bolus 1,000 mL (0 mLs Intravenous Stopped 08/26/13 1405)  ondansetron (ZOFRAN) injection 4 mg (4 mg Intravenous Given 08/26/13 1231)  morphine 4 MG/ML injection 4 mg (4 mg Intravenous Given 08/26/13 1252)  sodium chloride 0.9 % bolus 1,000 mL (0 mLs Intravenous Stopped 08/26/13 1539)  morphine 4 MG/ML injection 4 mg (4 mg Intravenous Given 08/26/13 1538)  iohexol (OMNIPAQUE) 300 MG/ML solution 100 mL (100 mLs Intravenous Contrast Given 08/26/13 1606)  morphine 4 MG/ML injection 4 mg (4 mg Intravenous Given 08/26/13 1717)   Labs Reviewed  CBC WITH DIFFERENTIAL - Abnormal; Notable for the following:    RBC 4.08 (*)    HCT 37.2 (*)    All other components within normal limits  COMPREHENSIVE METABOLIC PANEL - Abnormal; Notable for the following:    Glucose, Bld 103 (*)    All other components within normal limits  URINALYSIS, ROUTINE W REFLEX MICROSCOPIC - Abnormal; Notable for the following:    Hgb urine dipstick TRACE (*)    All other components within normal limits  URINE MICROSCOPIC-ADD ON - Abnormal; Notable for the following:    Casts HYALINE CASTS (*)    All other components within normal limits  LIPASE, BLOOD   Results for orders placed during the hospital encounter of 08/26/13  CBC WITH DIFFERENTIAL      Result Value Range   WBC 6.4  4.0 - 10.5 K/uL   RBC 4.08 (*) 4.22 - 5.81 MIL/uL   Hemoglobin 13.1  13.0 - 17.0 g/dL   HCT 78.2 (*) 95.6 - 21.3 %   MCV 91.2  78.0 - 100.0 fL   MCH 32.1  26.0 - 34.0 pg   MCHC 35.2  30.0 - 36.0 g/dL   RDW 08.6  57.8 - 46.9 %   Platelets 275  150 - 400 K/uL   Neutrophils Relative % 66  43 - 77 %   Neutro Abs 4.2  1.7 - 7.7 K/uL   Lymphocytes Relative 25  12 - 46 %   Lymphs Abs 1.6  0.7 - 4.0 K/uL   Monocytes Relative 7  3 - 12 %   Monocytes Absolute 0.4   0.1 - 1.0 K/uL   Eosinophils Relative 3  0 - 5 %   Eosinophils Absolute 0.2  0.0 - 0.7 K/uL   Basophils Relative 0  0 - 1 %   Basophils Absolute 0.0  0.0 - 0.1 K/uL  COMPREHENSIVE METABOLIC PANEL      Result Value Range   Sodium 142  137 - 147 mEq/L   Potassium 3.8  3.7 - 5.3 mEq/L   Chloride 101  96 - 112 mEq/L   CO2 27  19 - 32 mEq/L   Glucose, Bld 103 (*) 70 - 99 mg/dL   BUN 13  6 - 23 mg/dL   Creatinine, Ser 6.29  0.50 - 1.35 mg/dL   Calcium 8.9  8.4 - 52.8 mg/dL   Total Protein 6.7  6.0 - 8.3 g/dL   Albumin 3.8  3.5 - 5.2 g/dL   AST 17  0 - 37 U/L   ALT 12  0 - 53 U/L   Alkaline Phosphatase 71  39 - 117 U/L   Total Bilirubin 0.3  0.3 - 1.2 mg/dL  GFR calc non Af Amer >90  >90 mL/min   GFR calc Af Amer >90  >90 mL/min  URINALYSIS, ROUTINE W REFLEX MICROSCOPIC      Result Value Range   Color, Urine YELLOW  YELLOW   APPearance CLEAR  CLEAR   Specific Gravity, Urine 1.026  1.005 - 1.030   pH 5.5  5.0 - 8.0   Glucose, UA NEGATIVE  NEGATIVE mg/dL   Hgb urine dipstick TRACE (*) NEGATIVE   Bilirubin Urine NEGATIVE  NEGATIVE   Ketones, ur NEGATIVE  NEGATIVE mg/dL   Protein, ur NEGATIVE  NEGATIVE mg/dL   Urobilinogen, UA 0.2  0.0 - 1.0 mg/dL   Nitrite NEGATIVE  NEGATIVE   Leukocytes, UA NEGATIVE  NEGATIVE  LIPASE, BLOOD      Result Value Range   Lipase 29  11 - 59 U/L  URINE MICROSCOPIC-ADD ON      Result Value Range   Squamous Epithelial / LPF RARE  RARE   WBC, UA 0-2  <3 WBC/hpf   RBC / HPF 3-6  <3 RBC/hpf   Casts HYALINE CASTS (*) NEGATIVE   Ct Abdomen Pelvis W Contrast  08/26/2013   CLINICAL DATA:  Nausea with abdominal pain  EXAM: CT ABDOMEN AND PELVIS WITH CONTRAST  TECHNIQUE: Multidetector CT imaging of the abdomen and pelvis was performed using the standard protocol following bolus administration of intravenous contrast. Oral contrast was also administered.  CONTRAST:  OMNIPAQUE IOHEXOL 300 MG/ML  SOLN  COMPARISON:  February 05, 2011  FINDINGS: There is patchy  atelectasis in the lung bases. There is evidence of gastroesophageal reflux.  Liver is prominent, measuring 18.2 cm in length. No focal liver lesions are identified. There is no biliary duct dilatation. Gallbladder wall is not appreciably thickened.  Spleen, pancreas, and adrenals appear normal. Kidneys bilaterally show no mass or hydronephrosis on either side. There is no renal or ureteral calculus on either side.  In the pelvis, the urinary bladder is midline with normal wall thickness. There is no pelvic mass or fluid collection. Appendix appears normal.  There is some wall thickening in the descending colon without surrounding mesenteric thickening or abscess. There is no appreciable diverticular disease in this area.  There is no bowel obstruction.  No free air or portal venous air.  There is no ascites, adenopathy, or abscess in the abdomen or pelvis. Aorta is nonaneurysmal. There is degenerative change at L5-S1 with vacuum phenomenon and disc space narrowing. There is slight anterolisthesis of L5 on S1. 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 There are no blastic or lytic bone lesions.  IMPRESSION: There is mild wall thickening in the descending colon consistent with nonspecific colitis. There is no bowel obstruction. No abscess. Appendix appears normal. No renal or ureteral calculus. No hydronephrosis.  Liver is prominent but otherwise appears normal.  There is evidence of gastroesophageal reflux.  There is degenerative change at L5-S1 with mild spondylolisthesis at L5-S1. 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   Electronically Signed   By: Bretta Bang M.D.   On: 08/26/2013 16:26    Patient signed out to me by Irish Elders, NP with CT scan pending with likely plan to discharge patient home with symptomatic care. CT scan has resulted and shows nonspecific colitis w/o diverticular disease present, stable degenerative disc disease noted, GERD noted on scan as well. Scan otherwise unremarkable.  Pain improved over course of ED stay. Abdomen exam remained benign, mild tenderness still appreciated, but no guarding, rigidity,  absent bowel sounds, distention, or rebound tenderness on repeat abdominal examination. Will treat patient symptomatically for colitis including Flagyl given 1 week of symptoms to cover for possible bacterial source, GERD, and back pain. Advised PCP f/u. [facility] number was given. Return precautions discussed. Patient is agreeable to plan. Patient is stable at time of discharge. Patient d/w with Dr. Redgie GrayerMasneri, agrees with plan.       Jeannetta EllisJennifer L Trevelle Mcgurn, PA-C 08/26/13 2029

## 2013-08-26 NOTE — ED Notes (Signed)
Pt gowned.

## 2013-08-26 NOTE — Discharge Instructions (Signed)
Please follow up with your primary care physician in 1-2 days. If you do not have one please call the [facility] number listed above. Please follow up with the Gastroenterologist to schedule a follow up appointment. Please take your antibiotic until completion. Please take all medications as prescribed. Please take pain medication as prescribed and as needed for pain. Please do not drive on narcotic pain medication. Please read all discharge instructions and return precautions.   Colitis Colitis is inflammation of the colon. Colitis can be a short-term or long-standing (chronic) illness. Crohn's disease and ulcerative colitis are 2 types of colitis which are chronic. They usually require lifelong treatment. CAUSES  There are many different causes of colitis, including:  Viruses.  Germs (bacteria).  Medicine reactions. SYMPTOMS   Diarrhea.  Intestinal bleeding.  Pain.  Fever.  Throwing up (vomiting).  Tiredness (fatigue).  Weight loss.  Bowel blockage. DIAGNOSIS  The diagnosis of colitis is based on examination and stool or blood tests. X-rays, CT scan, and colonoscopy may also be needed. TREATMENT  Treatment may include:  Fluids given through the vein (intravenously).  Bowel rest (nothing to eat or drink for a period of time).  Medicine for pain and diarrhea.  Medicines (antibiotics) that kill germs.  Cortisone medicines.  Surgery. HOME CARE INSTRUCTIONS   Get plenty of rest.  Drink enough water and fluids to keep your urine clear or pale yellow.  Eat a well-balanced diet.  Call your caregiver for follow-up as recommended. SEEK IMMEDIATE MEDICAL CARE IF:   You develop chills.  You have an oral temperature above 102 F (38.9 C), not controlled by medicine.  You have extreme weakness, fainting, or dehydration.  You have repeated vomiting.  You develop severe belly (abdominal) pain or are passing bloody or tarry stools. MAKE SURE  YOU:   Understand these instructions.  Will watch your condition.  Will get help right away if you are not doing well or get worse. Document Released: 08/13/2004 Document Revised: 09/28/2011 Document Reviewed: 11/08/2009 [facility] Patient Information 2014 [place], [place].   Gastroesophageal Reflux Disease, Adult Gastroesophageal reflux disease (GERD) happens when acid from your stomach flows up into the esophagus. When acid comes in contact with the esophagus, the acid causes soreness (inflammation) in the esophagus. Over time, GERD may create small holes (ulcers) in the lining of the esophagus. CAUSES   Increased body weight. This puts pressure on the stomach, making acid rise from the stomach into the esophagus.  Smoking. This increases acid production in the stomach.  Drinking alcohol. This causes decreased pressure in the lower esophageal sphincter (valve or ring of muscle between the esophagus and stomach), allowing acid from the stomach into the esophagus.  Late evening meals and a full stomach. This increases pressure and acid production in the stomach.  A malformed lower esophageal sphincter. Sometimes, no cause is found. SYMPTOMS   Burning pain in the lower part of the mid-chest behind the breastbone and in the mid-stomach area. This may occur twice a week or more often.  Trouble swallowing.  Sore throat.  Dry cough.  Asthma-like symptoms including chest tightness, shortness of breath, or wheezing. DIAGNOSIS  Your caregiver may be able to diagnose GERD based on your symptoms. In some cases, X-rays and other tests may be done to check for complications or to check the condition of your stomach and esophagus. TREATMENT  Your caregiver may recommend over-the-counter or prescription medicines to help decrease acid production. Ask your caregiver before  starting or adding any new medicines.  HOME CARE INSTRUCTIONS   Change the factors that you can control. Ask your  caregiver for guidance concerning weight loss, quitting smoking, and alcohol consumption.  Avoid foods and drinks that make your symptoms worse, such as:  Caffeine or alcoholic drinks.  Chocolate.  Peppermint or mint flavorings.  Garlic and onions.  Spicy foods.  Citrus fruits, such as oranges, lemons, or limes.  Tomato-based foods such as sauce, chili, salsa, and pizza.  Fried and fatty foods.  Avoid lying down for the 3 hours prior to your bedtime or prior to taking a nap.  Eat small, frequent meals instead of large meals.  Wear loose-fitting clothing. Do not wear anything tight around your waist that causes pressure on your stomach.  Raise the head of your bed 6 to 8 inches with wood blocks to help you sleep. Extra pillows will not help.  Only take over-the-counter or prescription medicines for pain, discomfort, or fever as directed by your caregiver.  Do not take aspirin, ibuprofen, or other nonsteroidal anti-inflammatory drugs (NSAIDs). SEEK IMMEDIATE MEDICAL CARE IF:   You have pain in your arms, neck, jaw, teeth, or back.  Your pain increases or changes in intensity or duration.  You develop nausea, vomiting, or sweating (diaphoresis).  You develop shortness of breath, or you faint.  Your vomit is green, yellow, black, or looks like coffee grounds or blood.  Your stool is red, bloody, or black. These symptoms could be signs of other problems, such as heart disease, gastric bleeding, or esophageal bleeding. MAKE SURE YOU:   Understand these instructions.  Will watch your condition.  Will get help right away if you are not doing well or get worse. Document Released: 04/15/2005 Document Revised: 09/28/2011 Document Reviewed: 01/23/2011 [facility] Patient Information 2014 [place]

## 2013-08-26 NOTE — ED Provider Notes (Signed)
Medical screening examination/treatment/procedure(s) were performed by non-physician practitioner and as supervising physician I was immediately available for consultation/collaboration.  EKG Interpretation   None         David Masneri, MD 08/26/13 2335 

## 2013-08-26 NOTE — ED Notes (Signed)
Asked pt to provide a urine specimen pt stated he could not provide one at this time. States will try in a little while.

## 2013-08-26 NOTE — ED Notes (Signed)
Provided pt with sprite for fluid challenge

## 2013-08-26 NOTE — ED Provider Notes (Signed)
CSN: [id number]     Arrival date & time 08/26/13  1035 History   First MD Initiated Contact with Patient 08/26/13 1135     Chief Complaint  Patient presents with  . Abdominal Pain  . Flank Pain  . Nausea   (Consider location/radiation/quality/duration/timing/severity/associated sxs/prior Treatment) Patient is a 38 y.o. male presenting with abdominal pain and flank pain. The history is provided by the patient. No language interpreter was used.  Abdominal Pain Pain location:  Generalized Pain quality: sharp   Context: not recent travel and not sick contacts   Associated symptoms: diarrhea, nausea and vomiting   Associated symptoms: no chest pain, no chills, no dysuria and no fever   Diarrhea:    Quality:  Watery   Duration:  7 days   Timing:  Intermittent Nausea:    Severity:  Moderate   Progression:  Worsening Flank Pain Associated symptoms include abdominal pain, nausea and vomiting. Pertinent negatives include no chest pain, chills or fever.  Pt is a 38 year old male who presents today with complaints of abdominal pain and bilateral flank pain. He reports that he has been having generalized pain across his abdomen and it feels sharp and crampy. He reports that he also has flank pain. He reports diarrhea on/off for the last 7 days with a few periods of vomiting. He reports that his appetite has been very decreased. He denies fever, chills, dysuria or hematuria. He denies recent travel or sick contacts. No suspicious food or known exposure. He also reports that he has had some back problems and history of a degenerative disc that sometimes causes him to have back pain. The last episode that he reports with his back pain is about 6 months ago and he got better with heat, rest and pain meds. He reports that this pain feels different.   Past Medical History  Diagnosis Date  . Chronic kidney disease   . Neuromuscular disorder    Past Surgical History  Procedure Laterality Date  . Joint  replacement     No family history on file. History  Substance Use Topics  . Smoking status: Current Every Day Smoker -- 1.00 packs/day for 6 years  . Smokeless tobacco: Not on file  . Alcohol Use: Yes     Comment: socially    Review of Systems  Constitutional: Negative for fever and chills.  Cardiovascular: Negative for chest pain.  Gastrointestinal: Positive for nausea, vomiting, abdominal pain and diarrhea.  Genitourinary: Positive for flank pain. Negative for dysuria, urgency and difficulty urinating.  All other systems reviewed and are negative.    Allergies  Phenergan  Home Medications   Current Outpatient Rx  Name  Route  Sig  Dispense  Refill  . Aspirin-Salicylamide-Caffeine (BC HEADACHE POWDER PO)   Oral   Take 1 packet by mouth 2 (two) times daily as needed (pain).         Marland Kitchen oxyCODONE-acetaminophen (PERCOCET/ROXICET) 5-325 MG per tablet   Oral   Take 1 tablet by mouth every 3 (three) hours as needed for severe pain (pain).          BP 165/103  Pulse 95  Temp(Src) 97.8 F (36.6 C) (Oral)  Resp 18  SpO2 97% Physical Exam  Nursing note and vitals reviewed. Constitutional: He is oriented to person, place, and time. He appears well-developed and well-nourished. No distress.  HENT:  Head: Normocephalic and atraumatic.  Eyes: Conjunctivae and EOM are normal. Pupils are equal, round, and reactive to light.  Neck: Normal range of motion. Neck supple. No JVD present. No thyromegaly present.  Cardiovascular: Normal rate, regular rhythm and normal heart sounds.   Pulmonary/Chest: Effort normal and breath sounds normal. No respiratory distress.  Abdominal: Soft. Bowel sounds are normal. He exhibits no distension. There is generalized tenderness. There is no rigidity, no rebound and no guarding.  Musculoskeletal: Normal range of motion.  Lymphadenopathy:    He has no cervical adenopathy.  Neurological: He is alert and oriented to person, place, and time.  Skin:  Skin is warm and dry.  Psychiatric: He has a normal mood and affect. His behavior is normal. Judgment and thought content normal.    ED Course  Procedures (including critical care time) Labs Review Labs Reviewed - No data to display Imaging Review No results found.  EKG Interpretation   None       MDM   1. Diarrhea   2. Colitis   3. GERD (gastroesophageal reflux disease)   4. Back pain     Reports feeling somewhat better after IV fluids, zofran and morphine. [facility] PA-C to follow CT results and dispo.      Harold EldersKelly Walt Geathers, NP 09/03/13 2201

## 2013-08-26 NOTE — ED Notes (Signed)
Pt continues to drink CT contrast.

## 2013-08-26 NOTE — ED Notes (Signed)
Pt c/o nausea, bilateral flank pain, abd pain x 1 week has not been able to work.  States he feels just like when his kidneys shut down in 2013.

## 2013-09-04 NOTE — ED Provider Notes (Signed)
Medical screening examination/treatment/procedure(s) were performed by non-physician practitioner and as supervising physician I was immediately available for consultation/collaboration.  Flint MelterElliott L Taimur Fier, MD 09/04/13 [id number]

## 2013-09-18 ENCOUNTER — Emergency Department (HOSPITAL_COMMUNITY)
Admission: EM | Admit: 2013-09-18 | Discharge: 2013-09-18 | Disposition: A | Discharge status: Home / Self Care | Payer: Self-pay | Attending: Emergency Medicine | Admitting: Emergency Medicine

## 2013-09-18 ENCOUNTER — Encounter (HOSPITAL_COMMUNITY): Payer: Self-pay | Admitting: Emergency Medicine

## 2013-09-18 DIAGNOSIS — N189 Chronic kidney disease, unspecified: Secondary | ICD-10-CM | POA: Insufficient documentation

## 2013-09-18 DIAGNOSIS — R509 Fever, unspecified: Secondary | ICD-10-CM | POA: Insufficient documentation

## 2013-09-18 DIAGNOSIS — K6289 Other specified diseases of anus and rectum: Secondary | ICD-10-CM

## 2013-09-18 DIAGNOSIS — R110 Nausea: Secondary | ICD-10-CM | POA: Insufficient documentation

## 2013-09-18 DIAGNOSIS — Z8669 Personal history of other diseases of the nervous system and sense organs: Secondary | ICD-10-CM | POA: Insufficient documentation

## 2013-09-18 DIAGNOSIS — K644 Residual hemorrhoidal skin tags: Secondary | ICD-10-CM | POA: Insufficient documentation

## 2013-09-18 DIAGNOSIS — Z79899 Other long term (current) drug therapy: Secondary | ICD-10-CM | POA: Insufficient documentation

## 2013-09-18 DIAGNOSIS — F17200 Nicotine dependence, unspecified, uncomplicated: Secondary | ICD-10-CM | POA: Insufficient documentation

## 2013-09-18 LAB — CBC WITH DIFFERENTIAL/PLATELET
BASOS ABS: 0 10*3/uL (ref 0.0–0.1)
BASOS PCT: 0 % (ref 0–1)
Eosinophils Absolute: 0.1 10*3/uL (ref 0.0–0.7)
Eosinophils Relative: 1 % (ref 0–5)
HCT: 39.2 % (ref 39.0–52.0)
Hemoglobin: 13.8 g/dL (ref 13.0–17.0)
Lymphocytes Relative: 17 % (ref 12–46)
Lymphs Abs: 1.1 10*3/uL (ref 0.7–4.0)
MCH: 31.8 pg (ref 26.0–34.0)
MCHC: 35.2 g/dL (ref 30.0–36.0)
MCV: 90.3 fL (ref 78.0–100.0)
Monocytes Absolute: 0.5 10*3/uL (ref 0.1–1.0)
Monocytes Relative: 7 % (ref 3–12)
NEUTROS ABS: 4.9 10*3/uL (ref 1.7–7.7)
NEUTROS PCT: 75 % (ref 43–77)
Platelets: 342 10*3/uL (ref 150–400)
RBC: 4.34 MIL/uL (ref 4.22–5.81)
RDW: 13.3 % (ref 11.5–15.5)
WBC: 6.6 10*3/uL (ref 4.0–10.5)

## 2013-09-18 LAB — LIPASE, BLOOD: LIPASE: 27 U/L (ref 11–59)

## 2013-09-18 LAB — COMPREHENSIVE METABOLIC PANEL
ALBUMIN: 3.7 g/dL (ref 3.5–5.2)
ALT: 14 U/L (ref 0–53)
AST: 19 U/L (ref 0–37)
Alkaline Phosphatase: 67 U/L (ref 39–117)
BUN: 10 mg/dL (ref 6–23)
CALCIUM: 8.9 mg/dL (ref 8.4–10.5)
CO2: 24 mEq/L (ref 19–32)
CREATININE: 0.87 mg/dL (ref 0.50–1.35)
Chloride: 104 mEq/L (ref 96–112)
GFR calc Af Amer: >90 mL/min (ref >90)
Glucose, Bld: 97 mg/dL (ref 70–99)
Potassium: 4.1 mEq/L (ref 3.7–5.3)
Sodium: 141 mEq/L (ref 137–147)
Total Bilirubin: 0.5 mg/dL (ref 0.3–1.2)
Total Protein: 7 g/dL (ref 6.0–8.3)

## 2013-09-18 LAB — POC OCCULT BLOOD, ED: Fecal Occult Bld: NEGATIVE

## 2013-09-18 MED ORDER — HYDROCORTISONE ACETATE 25 MG RE SUPP: 25.0000 mg | Freq: Two times a day (BID) | RECTAL | Status: DC

## 2013-09-18 MED ORDER — MORPHINE SULFATE 4 MG/ML IJ SOLN
6.0000 mg | Freq: Once | INTRAMUSCULAR | Status: AC
  Administered 2013-09-18: 6 mg via INTRAVENOUS
  Filled 2013-09-18: qty 2

## 2013-09-18 MED ORDER — ONDANSETRON HCL 4 MG/2ML IJ SOLN
4.0000 mg | Freq: Once | INTRAMUSCULAR | Status: AC
  Administered 2013-09-18: 4 mg via INTRAVENOUS
  Filled 2013-09-18: qty 2

## 2013-09-18 MED ORDER — OXYCODONE-ACETAMINOPHEN 5-325 MG PO TABS
2.0000 | ORAL_TABLET | Freq: Once | ORAL | Status: AC
  Administered 2013-09-18: 2 via ORAL
  Filled 2013-09-18: qty 2

## 2013-09-18 MED ORDER — SODIUM CHLORIDE 0.9 % IV BOLUS (SEPSIS)
1000.0000 mL | Freq: Once | INTRAVENOUS | Status: AC
  Administered 2013-09-18: 1000 mL via INTRAVENOUS

## 2013-09-18 MED ORDER — OXYCODONE-ACETAMINOPHEN 5-325 MG PO TABS: 1.0000 | ORAL_TABLET | ORAL | Status: DC | PRN

## 2013-09-18 MED ORDER — IBUPROFEN 600 MG PO TABS: 600.0000 mg | ORAL_TABLET | Freq: Four times a day (QID) | ORAL | Status: DC | PRN

## 2013-09-18 MED ORDER — DOCUSATE SODIUM 100 MG PO CAPS: 100.0000 mg | ORAL_CAPSULE | Freq: Two times a day (BID) | ORAL | Status: DC

## 2013-09-18 NOTE — ED Provider Notes (Signed)
CSN: [id number]     Arrival date & time 09/18/13  1138 History   First MD Initiated Contact with Patient 09/18/13 1746     Chief Complaint  Patient presents with  . Rectal Pain     (Consider location/radiation/quality/duration/timing/severity/associated sxs/prior Treatment) HPI 38 yo male with recent "intestinal infection" about "1 month ago". Patient states he was treated with antibiotics and sent home. Patient now having Rectal pain that started yesterday morning while he was getting ready. Patient states he has not had BM since yesterday. Denies any bloody stools or BRBPR. Patient  States his pain is sharp 7/10 pain that is constant with radiation up the rectum. Admits to fever/chills and nausea. Denies any vomiting or diarrhea. Patient states pain is worse with sitting and straining. Patient States he went to lift a window earlier today and had to set it back down because the pain was so bad. Denies any hx of hemorrhoids.  Past Medical History  Diagnosis Date  . Chronic kidney disease   . Neuromuscular disorder    Past Surgical History  Procedure Laterality Date  . Joint replacement     No family history on file. History  Substance Use Topics  . Smoking status: Current Every Day Smoker -- 1.00 packs/day for 6 years  . Smokeless tobacco: Not on file  . Alcohol Use: No     Comment: socially    Review of Systems  All other systems reviewed and are negative.      Allergies  Phenergan  Home Medications   Current Outpatient Rx  Name  Route  Sig  Dispense  Refill  . acetaminophen (TYLENOL) 500 MG tablet   Oral   Take 1,000 mg by mouth every 4 (four) hours as needed for mild pain.         . Aspirin-Salicylamide-Caffeine (BC HEADACHE POWDER PO)   Oral   Take 1 packet by mouth 2 (two) times daily as needed (pain).         Marland Kitchen. omeprazole (PRILOSEC) 20 MG capsule   Oral   Take 1 capsule (20 mg total) by mouth daily.   15 capsule   0   . docusate sodium (COLACE) 100  MG capsule   Oral   Take 1 capsule (100 mg total) by mouth every 12 (twelve) hours.   30 capsule   0   . hydrocortisone (ANUSOL-HC) 25 MG suppository   Rectal   Place 1 suppository (25 mg total) rectally 2 (two) times daily.   12 suppository   0   . ibuprofen (ADVIL,MOTRIN) 600 MG tablet   Oral   Take 1 tablet (600 mg total) by mouth every 6 (six) hours as needed.   30 tablet   0   . oxyCODONE-acetaminophen (PERCOCET) 5-325 MG per tablet   Oral   Take 1-2 tablets by mouth every 4 (four) hours as needed.   20 tablet   0    BP 131/81  Pulse 85  Temp(Src) 98.4 F (36.9 C) (Oral)  Resp 14  Ht 5\' 10"  (1.778 m)  Wt 169 lb (76.658 kg)  BMI 24.25 kg/m2  SpO2 99% Physical Exam  Nursing note and vitals reviewed. Constitutional: He is oriented to person, place, and time. He appears well-developed and well-nourished. No distress.  HENT:  Head: Normocephalic and atraumatic.  Eyes: Conjunctivae are normal. No scleral icterus.  Neck: No JVD present. No tracheal deviation present.  Cardiovascular: Normal rate and regular rhythm.  Exam reveals no gallop and no  friction rub.   No murmur heard. Pulmonary/Chest: Effort normal and breath sounds normal. No respiratory distress. He has no wheezes. He has no rhonchi. He has no rales.  Abdominal: Soft. Bowel sounds are normal. He exhibits no distension. There is no hepatosplenomegaly. There is no tenderness. There is no rigidity, no rebound, no guarding, no CVA tenderness, no tenderness at McBurney's point and negative Murphy's sign.  Genitourinary: Prostate normal. Rectal exam shows external hemorrhoid and tenderness. Rectal exam shows no internal hemorrhoid, no fissure, no mass and anal tone normal. Guaiac negative stool.     External nonthrombosed hemorrhoid as depicted in diagram above.    Musculoskeletal: Normal range of motion. He exhibits no edema.  Neurological: He is alert and oriented to person, place, and time.  Skin: Skin is  warm and dry. He is not diaphoretic.  Psychiatric: He has a normal mood and affect. His behavior is normal.    ED Course  Procedures (including critical care time) Labs Review Labs Reviewed  COMPREHENSIVE METABOLIC PANEL  LIPASE, BLOOD  CBC WITH DIFFERENTIAL  POC OCCULT BLOOD, ED   Imaging Review No results found.   EKG Interpretation None      MDM   Final diagnoses:  External hemorrhoid  Rectal pain    Patient afebrile  Hemoccult negative  CMP WNL Lipase negative CBC WNL.  Exam reveals non-thrombosed hemorrhoid.  Plan to treat patient conservatively for hemorrhoids with Hydrocortisone suppository, pain management, stool softeners, and high fiber diet. Plan to have patient follow up with PCP in 2 days for further evaluation. Patient advised to return to Emergency department if symptoms should worsen despite treatment.   Meds given in ED:  Medications  sodium chloride 0.9 % bolus 1,000 mL (0 mLs Intravenous Stopped 09/18/13 1937)  morphine 4 MG/ML injection 6 mg (6 mg Intravenous Given 09/18/13 1858)  ondansetron (ZOFRAN) injection 4 mg (4 mg Intravenous Given 09/18/13 1858)  oxyCODONE-acetaminophen (PERCOCET/ROXICET) 5-325 MG per tablet 2 tablet (2 tablets Oral Given 09/18/13 1938)    Discharge Medication List as of 09/18/2013  7:28 PM    START taking these medications   Details  docusate sodium (COLACE) 100 MG capsule Take 1 capsule (100 mg total) by mouth every 12 (twelve) hours., Starting 09/18/2013, Until Discontinued, Print    hydrocortisone (ANUSOL-HC) 25 MG suppository Place 1 suppository (25 mg total) rectally 2 (two) times daily., Starting 09/18/2013, Until Discontinued, Print    ibuprofen (ADVIL,MOTRIN) 600 MG tablet Take 1 tablet (600 mg total) by mouth every 6 (six) hours as needed., Starting 09/18/2013, Until Discontinued, Print    oxyCODONE-acetaminophen (PERCOCET) 5-325 MG per tablet Take 1-2 tablets by mouth every 4 (four) hours as needed., Starting 09/18/2013,  Until Discontinued, Print           Rudene Anda, PA-C 09/19/13 1300

## 2013-09-18 NOTE — Discharge Instructions (Signed)
Follow up with a primary care provider in 2 days for reevaluation. Resource guide provided below for further follow up. Take pain medication as directed. Do not drive with pain medication. Take stool softener (Docusate) to avoid constipation. Use Hydrocortisone suppository as directed. Return to emergency department for any worsening symptoms. Eat high fiber diet.    Emergency Department Resource Guide 1) Find a Doctor and Pay Out of Pocket Although you won't have to find out who is covered by your insurance plan, it is a good idea to ask around and get recommendations. You will then need to call the office and see if the doctor you have chosen will accept you as a new patient and what types of options they offer for patients who are self-pay. Some doctors offer discounts or will set up payment plans for their patients who do not have insurance, but you will need to ask so you aren't surprised when you get to your appointment.  2) Contact Your Local Health Department Not all health departments have doctors that can see patients for sick visits, but many do, so it is worth a call to see if yours does. If you don't know where your local health department is, you can check in your phone book. The CDC also has a tool to help you locate your state's health department, and many state websites also have listings of all of their local health departments.  3) Find a Walk-in Clinic If your illness is not likely to be very severe or complicated, you may want to try a walk in clinic. These are popping up all over the country in pharmacies, drugstores, and shopping centers. They're usually staffed by nurse practitioners or physician assistants that have been trained to treat common illnesses and complaints. They're usually fairly quick and inexpensive. However, if you have serious medical issues or chronic medical problems, these are probably not your best option.  No Primary Care Doctor: - Call Health Connect at   [phone] - they can help you locate a primary care doctor that  accepts your insurance, provides certain services, etc. - Physician Referral Service- [phone]  Chronic Pain Problems: Organization         Address  Phone   Notes  [facility]  [phone]) [phone] Patients need to be referred by their primary care doctor.   Medication Assistance: Organization         Address  Phone   Notes  [facility] [address] [place], [place] [address] [phone] --Must be a resident of [facility] [place] -- Must have NO insurance coverage whatsoever (no Medicaid/ Medicare, etc.) -- The pt. MUST have a primary care doctor that directs their care regularly and follows them in the community   MedAssist  [phone]   [employer]  [phone]    Agencies that provide inexpensive medical care: Organization         Address  Phone   Notes  [facility]  [phone]   [facility]    [phone]   [facility] [address] [place], [place] [address] [phone]) [phone]   [facility] [address], [place] [phone]   [facility]anned [facility]    [phone]   [facility]    [phone]) [phone]   [facility]  [address], [place] Phone:  [phone]) [phone], Fax:  [phone])  [phone] Hours of Operation:  9 am - 6 pm, M-F.  Also accepts Medicaid/Medicare and self-pay.  [facility] for [place], [address], [place] Phone: [phone], Fax: [phone]. Hours of Operation:  8:30 am - 5:30 pm, M-F.  Also accepts Medicaid and self-pay.  [facility] [address], [place] Phone: [phone]   [place], [place], [place] [phone], Ext. [phone] Mondays & Thursdays: 7-9 AM.  First 15 patients are seen on a first come, first serve basis.     [place] Providers:  Organization         Address  Phone   Notes  [facility] [address], [place] [phone] Also accepts self-pay patients.  [facility] [address] [place], [place]  [phone]   [place], [address], [place] [phone]   [facility] [address] [place] [phone]   Lucianne Lei [address], [place]   [phone] Only accepts [place] Access [place] patients after they have their name applied to their card.   Self-Pay (no insurance) in [facility]:  Organization         Address  Phone   Notes  Sickle Cell Patients, [facility] [place] [phone]   [facility] [place] [phone]   [facility] [place]  [place], [place], [place] [phone]   [facility]/Dr. Osei-Bonsu  [address], [place] or [address], [place] [phone] Phone number for both [place] and [place] locations is the same.  Urgent Medical and [facility] [address] [place] [phone]   [facility] [address], [place] or [address] [phone]   [facility] [address] [place] [phone], phone; [phone], fax Sees patients 1st and 3rd Saturday of every month.  Must not qualify for public or private insurance (i.e. Medicaid, Medicare, [place] Health Choice, Veterans' Benefits)  Household income should be no more than 200% of the poverty level The clinic cannot treat you if you are pregnant or think you are pregnant  Sexually transmitted diseases are not treated at the clinic.    Dental Care: Organization         Address  Phone  Notes  [facility]  Department of [facility] [place] [phone] Accepts children up to age 56 who are enrolled in [place] or [place]; pregnant women with a Medicaid card; and children who have applied for Medicaid or [place] Health Choice, but were declined, whose parents can pay a reduced fee at time of service.  [facility] Department of [facility]  [address], [place] [phone] Accepts children up to age 63 who are enrolled in [place] or [place]; pregnant women with a Medicaid card; and children who have applied for Medicaid or [place] Health Choice, but were declined, whose parents can pay a reduced fee at time of service.  [facility] Access PROGRAM  [place] [phone] Patients are seen by appointment only. Walk-ins are not accepted. [place]  will see patients 67 years of age and older. Monday - Tuesday (8am-5pm) Most Wednesdays (8:30-5pm) $30 per visit, cash only  [facility] Adult Dental Access PROGRAM  [address], [facility] [phone] Patients are seen by appointment only. Walk-ins are not accepted. [place] will see patients 32 years of age and older. One Wednesday Evening (Monthly: Volunteer Based).  $30 per visit, cash only  [place]  [phone] for adults; Children under age 60, call Graduate Pediatric Dentistry at [phone]. Children aged 74-14, please call [phone] to request a pediatric application.  Dental services are provided in all areas of dental care including fillings, crowns and bridges, complete and partial dentures, implants, gum treatment, root canals, and extractions. Preventive care is also provided. Treatment is provided to both adults and children. Patients are selected via a lottery and there is often a waiting list.   [facility] [address], [place]  [phone]  [email or web address]   [facility] [address] [place], [place] [phone], Ext. [phone] Second and Fourth Thursday of each month, opens at 6:30 AM; Clinic ends at 9 AM.  Patients are seen on a first-come first-served basis, and a limited number are seen during each clinic.   [facility]  [address] Hillard Danker [place], [place] [phone]   Eligibility Requirements You must have lived in [place], [place], or [place] counties for at least the last three months.   You cannot be eligible for state or federal sponsored [employer], including [employer], [place], or [employer].   You generally cannot be eligible for healthcare insurance through your employer.    How to apply: Eligibility screenings are held every Tuesday and Wednesday afternoon from 1:00 pm until 4:00 pm. You do not need an appointment for the interview!  [facility] [address] [place], [place]   [place]  [place] Department  [place]  [phone]    Behavioral Health Resources in the Community: Intensive Outpatient Programs Organization         Address  Phone  Notes  [place]. [address], [place], [place] [phone]   [facility] Outpatient [address] [place], [place]   ADS: Alcohol & Drug Svcs [address], [place], [place]   [place] [address],  [place], [place] or [phone]   Substance Abuse Resources Organization         Address  Phone  Notes  Alcohol and Drug Services  [phone]   [place]  [phone]   [place]   Chinita Pester  [phone]   Residential & Outpatient Substance Abuse Program  [phone]   Psychological Services Organization          Address  Phone  Notes  [facility] [place]  Hall  [phone]   [place] [address], [place] or [phone]    Mobile Crisis Teams Organization         Address  Phone  Notes  [facility], Mobile Crisis Care Unit  [phone]   [facility] Psychotherapeutic Services  [address] [place], [place]   [facility] [address] [place] [phone]    Self-Help/Support Groups Organization  Address  Phone             Notes  [place]. of [place] - variety of support groups  [place] Call for more information  Narcotics Anonymous (NA), [facility] [address], [employer] [place]  2 meetings at this location   Special educational needs teacher         Address  Phone  Notes  [facility] Residential Treatment [place],    [place]  [phone]   [facility]  [address] [place] [address]1638, [place], [place]   [place], [place] [phone] Admissions: 8am-3pm M-F  Incentives Substance [place] [address]    [place], [place] [phone]   [facility] [address] [place], [place], [place]   [facility] [address]  [place], [place]   [facility] - Intensive Outpatient [address], [place], [place]   [facility] ([place].) [place].,  [place], [place] [phone] or [phone]   [facility] Services (RTS) [address] [place], [place] Accepts Medicaid  [facility] [place] [address],  [place] [phone] Substance Abuse/Addiction Treatment   [facility] Organization         Address  Phone  Notes  [facility]  [phone]   Domenic Schwab, PhD [address] [place]lis [place], [place]   [phone] or [phone]   [place], [place] [phone]   [facility] [address], [place], [place] [phone] Insurance/Medicaid/sponsorship through [facility] and Families [address], [address] [place]                                    [place], [place] [phone] [place] [address], [place] [phone]    Dr. Adele Schilder  [phone]   [facility] Dept. 1) [address] [place] 2) [place] 3)  [place] [address], [place] [phone]  [phone]   [place] [phone] or [phone] (After Hours)

## 2013-09-18 NOTE — ED Notes (Signed)
Patient reports he has had rectal pain for 2 days.  He reports some bleeding as well.  Patient had normal bm on yesterday.  He has hx of recent bowel infection.  Patient states he has not noticed any changes to his stool.  No obvious external hermorrhoid.  Patient is alert and oriented.  States he attempted to go to work today but could not due to pain

## 2013-09-21 NOTE — ED Provider Notes (Signed)
Medical screening examination/treatment/procedure(s) were performed by non-physician practitioner and as supervising physician I was immediately available for consultation/collaboration.   EKG Interpretation None        Ravinder Lukehart M Memori Sammon, DO 09/21/13 1406 

## 2014-01-16 ENCOUNTER — Emergency Department (HOSPITAL_COMMUNITY): Payer: Self-pay

## 2014-01-16 ENCOUNTER — Encounter (HOSPITAL_COMMUNITY): Payer: Self-pay | Admitting: Emergency Medicine

## 2014-01-16 ENCOUNTER — Emergency Department (HOSPITAL_COMMUNITY)
Admission: EM | Admit: 2014-01-16 | Discharge: 2014-01-16 | Disposition: A | Discharge status: Home / Self Care | Payer: Self-pay | Attending: Emergency Medicine | Admitting: Emergency Medicine

## 2014-01-16 DIAGNOSIS — S81809A Unspecified open wound, unspecified lower leg, initial encounter: Secondary | ICD-10-CM | POA: Insufficient documentation

## 2014-01-16 DIAGNOSIS — Y9289 Other specified places as the place of occurrence of the external cause: Secondary | ICD-10-CM | POA: Insufficient documentation

## 2014-01-16 DIAGNOSIS — W268XXA Contact with other sharp object(s), not elsewhere classified, initial encounter: Secondary | ICD-10-CM | POA: Insufficient documentation

## 2014-01-16 DIAGNOSIS — N189 Chronic kidney disease, unspecified: Secondary | ICD-10-CM | POA: Insufficient documentation

## 2014-01-16 DIAGNOSIS — IMO0002 Reserved for concepts with insufficient information to code with codable children: Secondary | ICD-10-CM

## 2014-01-16 DIAGNOSIS — Z8669 Personal history of other diseases of the nervous system and sense organs: Secondary | ICD-10-CM | POA: Insufficient documentation

## 2014-01-16 DIAGNOSIS — F17200 Nicotine dependence, unspecified, uncomplicated: Secondary | ICD-10-CM | POA: Insufficient documentation

## 2014-01-16 DIAGNOSIS — Y9389 Activity, other specified: Secondary | ICD-10-CM | POA: Insufficient documentation

## 2014-01-16 MED ORDER — OXYCODONE HCL 5 MG PO TABS: 5.0000 mg | ORAL_TABLET | ORAL | Status: DC | PRN

## 2014-01-16 MED ORDER — HYDROMORPHONE HCL PF 1 MG/ML IJ SOLN
1.0000 mg | Freq: Once | INTRAMUSCULAR | Status: AC
  Administered 2014-01-16: 1 mg via INTRAVENOUS
  Filled 2014-01-16: qty 1

## 2014-01-16 MED ORDER — MORPHINE SULFATE 4 MG/ML IJ SOLN
6.0000 mg | Freq: Once | INTRAMUSCULAR | Status: AC
  Administered 2014-01-16: 6 mg via INTRAVENOUS
  Filled 2014-01-16: qty 2

## 2014-01-16 MED ORDER — ONDANSETRON HCL 4 MG/2ML IJ SOLN
4.0000 mg | Freq: Once | INTRAMUSCULAR | Status: AC
  Administered 2014-01-16: 4 mg via INTRAVENOUS
  Filled 2014-01-16: qty 2

## 2014-01-16 MED ORDER — LIDOCAINE-EPINEPHRINE 2 %-1:100000 IJ SOLN
20.0000 mL | Freq: Once | INTRAMUSCULAR | Status: DC
  Filled 2014-01-16: qty 20

## 2014-01-16 NOTE — Discharge Instructions (Signed)
PLEASE DO NOT DRIVE WHILE YOU ARE TAKING PAIN MEDICATIONS, IT IS DANGEROUS AND COULD BE LIFE THREATENING.

## 2014-01-16 NOTE — ED Notes (Signed)
He was repairing a glass window and it broke and piece fell onto L anterior leg. approx 2 inch laceration to anterior calf cleaned and dressed by staff on arrival to ed. He states his foot "feels tingly" below the laceration. He is able to wiggle his toes, skin w/d, skin color wnl

## 2014-01-16 NOTE — ED Notes (Signed)
MD at bedside to suture.

## 2014-01-16 NOTE — ED Notes (Signed)
Laceration bleeding through dressing in waiting room. L leg elevated and re-wrapped with pressure dressing. Pt tolerated without difficulty. Will continue to monitor.

## 2014-01-16 NOTE — ED Provider Notes (Signed)
CSN: [id number]     Arrival date & time 01/16/14  1339 History   First MD Initiated Contact with Patient 01/16/14 1611     Chief Complaint  Patient presents with  . Extremity Laceration     (Consider location/radiation/quality/duration/timing/severity/associated sxs/prior Treatment) Patient is a 38 y.o. male presenting with skin laceration.  Laceration Location:  Leg Leg laceration location:  L lower leg Length (cm):  3 Depth:  Cutaneous Quality: jagged   Bleeding: controlled with pressure   Time since incident:  3 hours Laceration mechanism:  Broken glass Pain details:    Quality:  Sharp   Severity:  Severe   Timing:  Constant Foreign body present:  Unable to specify Relieved by:  Nothing Tetanus status:  Up to date   Past Medical History  Diagnosis Date  . Chronic kidney disease   . Neuromuscular disorder    Past Surgical History  Procedure Laterality Date  . Joint replacement     History reviewed. No pertinent family history. History  Substance Use Topics  . Smoking status: Current Every Day Smoker -- 1.00 packs/day for 6 years  . Smokeless tobacco: Not on file  . Alcohol Use: No     Comment: socially    Review of Systems  Constitutional: Negative for fever and chills.  HENT: Negative for congestion and rhinorrhea.   Eyes: Negative for pain.  Respiratory: Negative for cough and shortness of breath.   Cardiovascular: Negative for chest pain and palpitations.  Gastrointestinal: Negative for vomiting, abdominal pain, diarrhea and constipation.  Endocrine: Negative for polydipsia and polyuria.  Genitourinary: Negative for dysuria and flank pain.  Musculoskeletal: Negative for back pain and neck pain.  Skin: Positive for wound. Negative for color change.  Neurological: Negative for dizziness, numbness and headaches.      Allergies  Phenergan  Home Medications   Prior to Admission medications   Medication Sig Start Date End Date Taking? Authorizing  Tahj Lindseth  acetaminophen (TYLENOL) 500 MG tablet Take 1,000 mg by mouth every 4 (four) hours as needed for mild pain.   Yes Historical Antonios Ostrow, MD  Aspirin-Salicylamide-Caffeine (BC HEADACHE POWDER PO) Take 1 packet by mouth 2 (two) times daily as needed (pain).   Yes Historical Sparkle Aube, MD  oxyCODONE (ROXICODONE) 5 MG immediate release tablet Take 1-2 tablets (5-10 mg total) by mouth every 4 (four) hours as needed for severe pain. 01/16/14   Marily MemosJason Mesner, MD   BP 148/98  Pulse 83  Temp(Src) 97.5 F (36.4 C) (Oral)  Resp 11  Ht 5\' 10"  (1.778 m)  Wt 170 lb (77.111 kg)  BMI 24.39 kg/m2  SpO2 97% Physical Exam  Nursing note and vitals reviewed. Constitutional: He is oriented to person, place, and time. He appears well-developed and well-nourished.  HENT:  Head: Normocephalic and atraumatic.  Eyes: Conjunctivae and EOM are normal. Pupils are equal, round, and reactive to light.  Neck: Normal range of motion.  Cardiovascular: Normal rate and regular rhythm.   Pulmonary/Chest: Effort normal and breath sounds normal.  Abdominal: Soft. He exhibits no distension. There is no tenderness.  Musculoskeletal: Normal range of motion. He exhibits no edema and no tenderness.  Neurological: He is alert and oriented to person, place, and time.  Skin: Skin is warm and dry.  Jagged 3 cm laceration to left anterior tibia area. Strong dp pulse, senation and motor intact distally.     ED Course  LACERATION REPAIR Date/Time: 01/16/2014 7:12 PM Performed by: Marily MemosMESNER, JASON Authorized by: Purvis SheffieldHARRISON, FORREST, SKathie Rhodes  Consent: Verbal consent obtained. Risks and benefits: risks, benefits and alternatives were discussed Consent given by: patient Patient understanding: patient states understanding of the procedure being performed Patient consent: the patient's understanding of the procedure matches consent given Patient identity confirmed: verbally with patient and arm band Body area: lower extremity Location  details: left lower leg Laceration length: 4 cm Foreign bodies: no foreign bodies Tendon involvement: none Nerve involvement: none Vascular damage: no Local anesthetic: lidocaine 2% with epinephrine Anesthetic total: 15 ml Patient sedated: no Preparation: Patient was prepped and draped in the usual sterile fashion. Irrigation method: syringe Amount of cleaning: extensive Debridement: none Degree of undermining: none Skin closure: 3-0 Prolene Subcutaneous closure: 3-0 Vicryl Number of sutures: 7 deep, 10 superficial. Technique: simple Approximation: loose Approximation difficulty: complex Dressing: pressure dressing, 4x4 sterile gauze and non-adhesive packing strip Patient tolerance: Patient tolerated the procedure well with no immediate complications.   (including critical care time) Labs Review Labs Reviewed - No data to display  Imaging Review Dg Tibia/fibula Left  01/16/2014   CLINICAL DATA:  Trauma, glass broke on left lower leg  EXAM: LEFT TIBIA AND FIBULA - 2 VIEW  COMPARISON:  None.  FINDINGS: No fracture or dislocation. Extensive opacity from gauze wrapping the lower extremity. Cannot exclude 7 mm at linear foreign body posteriorly at the level of the junction of the middle and distal thirds of the lower extremity.  IMPRESSION: Cannot exclude foreign body.   Electronically Signed   By: Esperanza Heiraymond  Rubner M.D.   On: 01/16/2014 17:59     EKG Interpretation None      MDM   Final diagnoses:  Laceration    38 yo M w/ large shard of glass cut his leg prior to arrival, bleeding controlled, tetanus UTD, NVI distally. Will get xr to eval for foreign body, if negative will numb, clean and suture.  Patient large defect that was difficult to approximate 2/2 swelling. Cleaned extensively without foreign body identified in wound. Loosely approximated, bleeding controlled with pressure. Still NVI after pressure dressing. Instructions given for changing the pressure dressing in 24-36  hours, s/s of infection to watch out for. Crutches provided 2/2 pain.     Marily MemosJason Mesner, MD 01/17/14 [id number]

## 2014-01-17 NOTE — ED Provider Notes (Signed)
Medical screening examination/treatment/procedure(s) were conducted as a shared visit with resident physician and myself.  I personally evaluated the patient during the encounter.   I interviewed and examined the patient. Lungs are CTAB. Cardiac exam wnl. Abdomen soft.  Jagged 3 cm laceration to left anterior tibia area. Normal sensation and motor skills in LLE. 2+ distal pulses. Wound explored w/out evidence of fb. I directly supervised the laceration repair.    Junius ArgyleForrest S Harrison, MD 01/17/14 [phone]

## 2014-10-12 IMAGING — CR DG TIBIA/FIBULA 2V*L*
4 series · 4 of 4 positions shown · non-contrast
Comparison: None.

CLINICAL DATA: Trauma, glass broke on left lower leg

EXAM:
LEFT TIBIA AND FIBULA - 2 VIEW

[x tib-fib ap left]
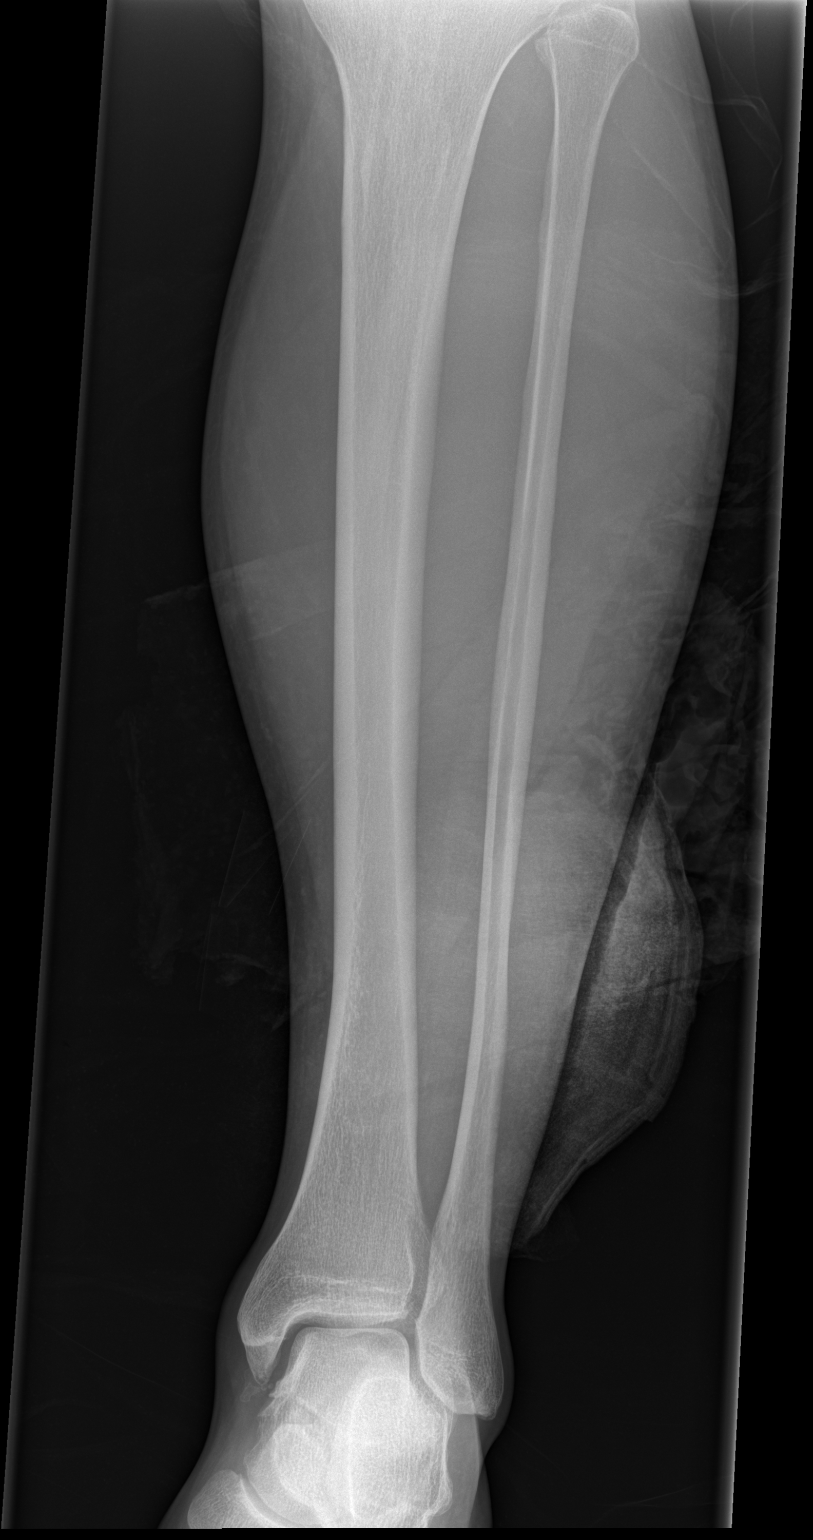

[x tib-fib lat left (1 of 3)]
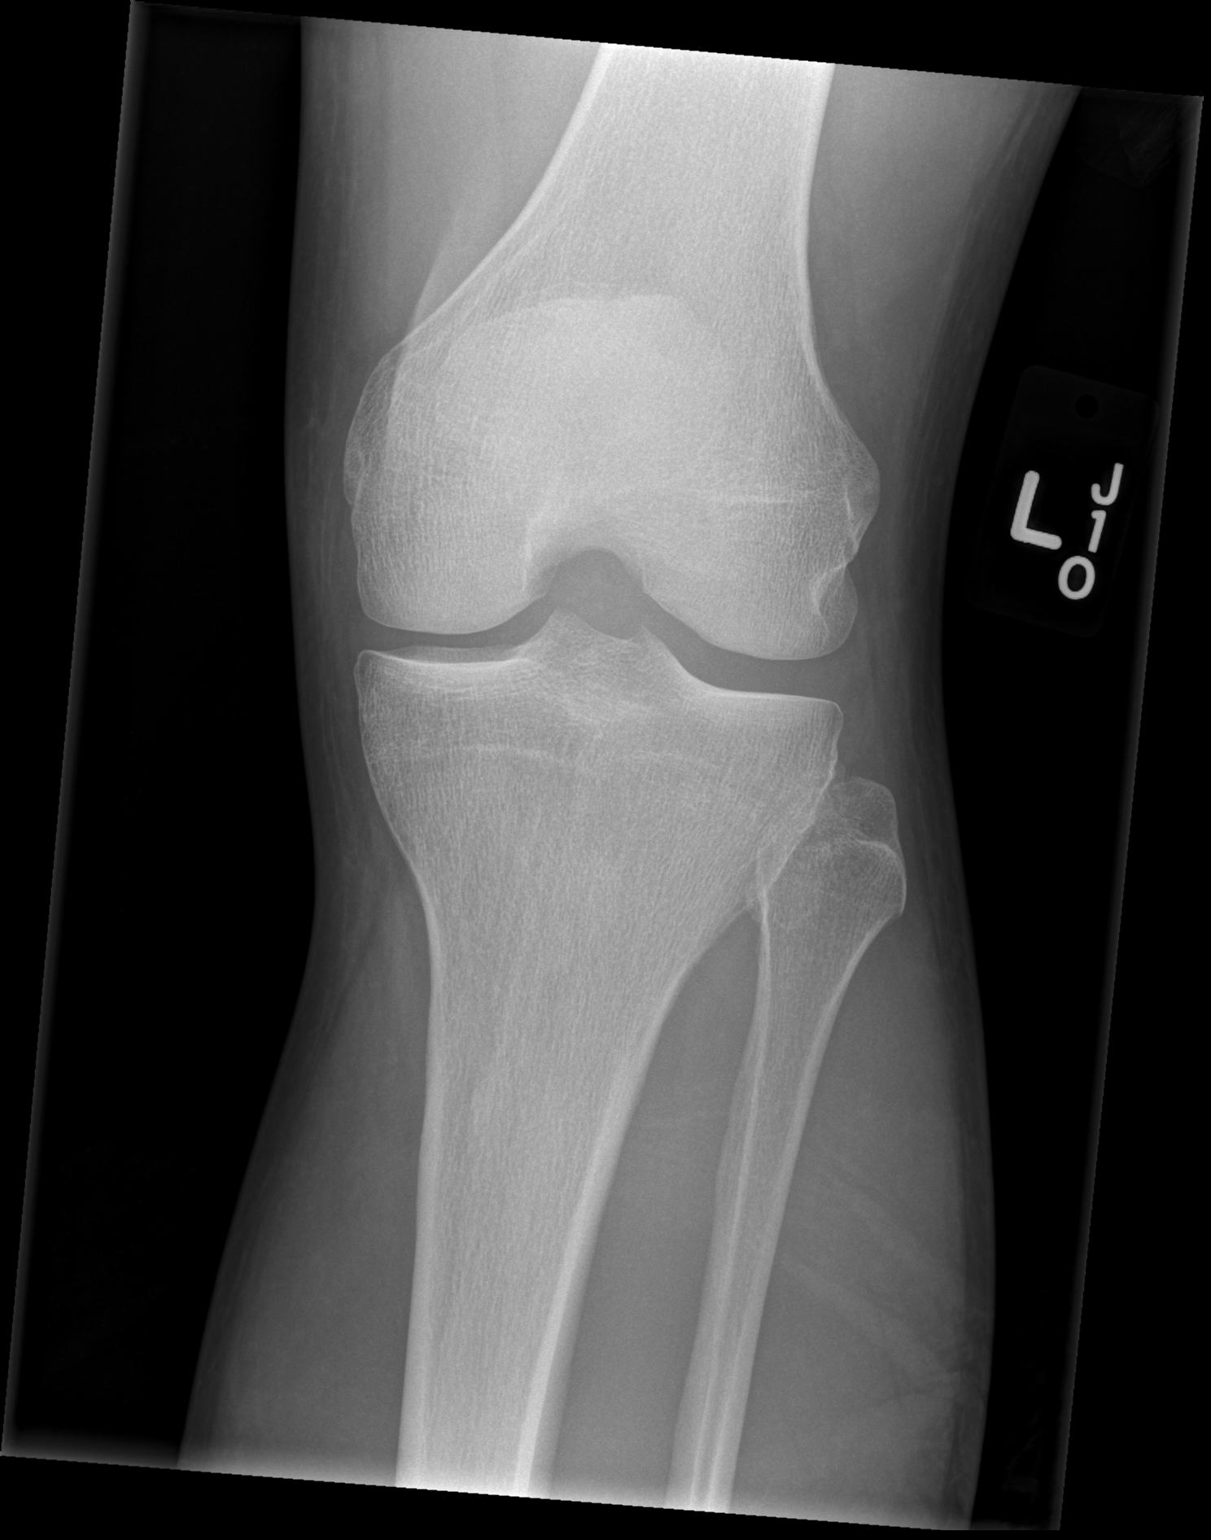

[x tib-fib lat left (2 of 3)]
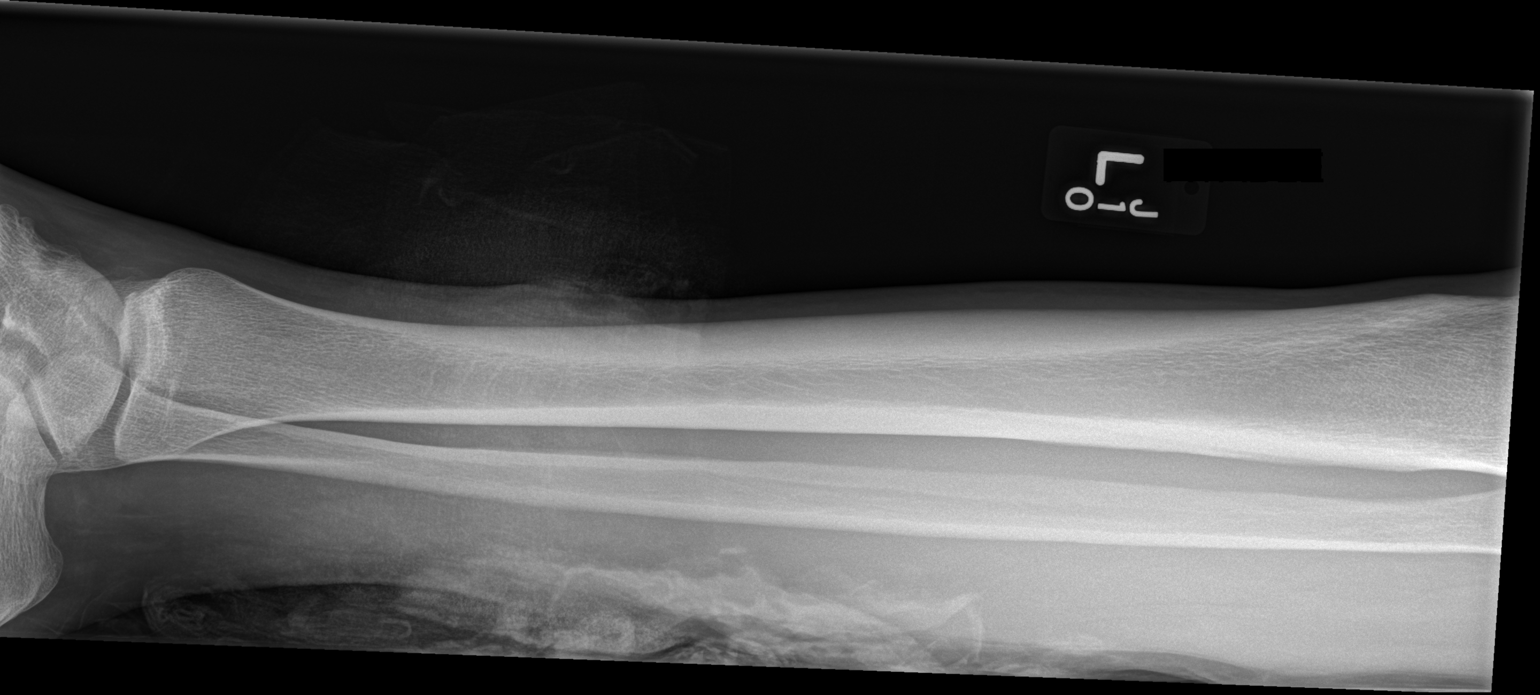

[x tib-fib lat left (3 of 3)]
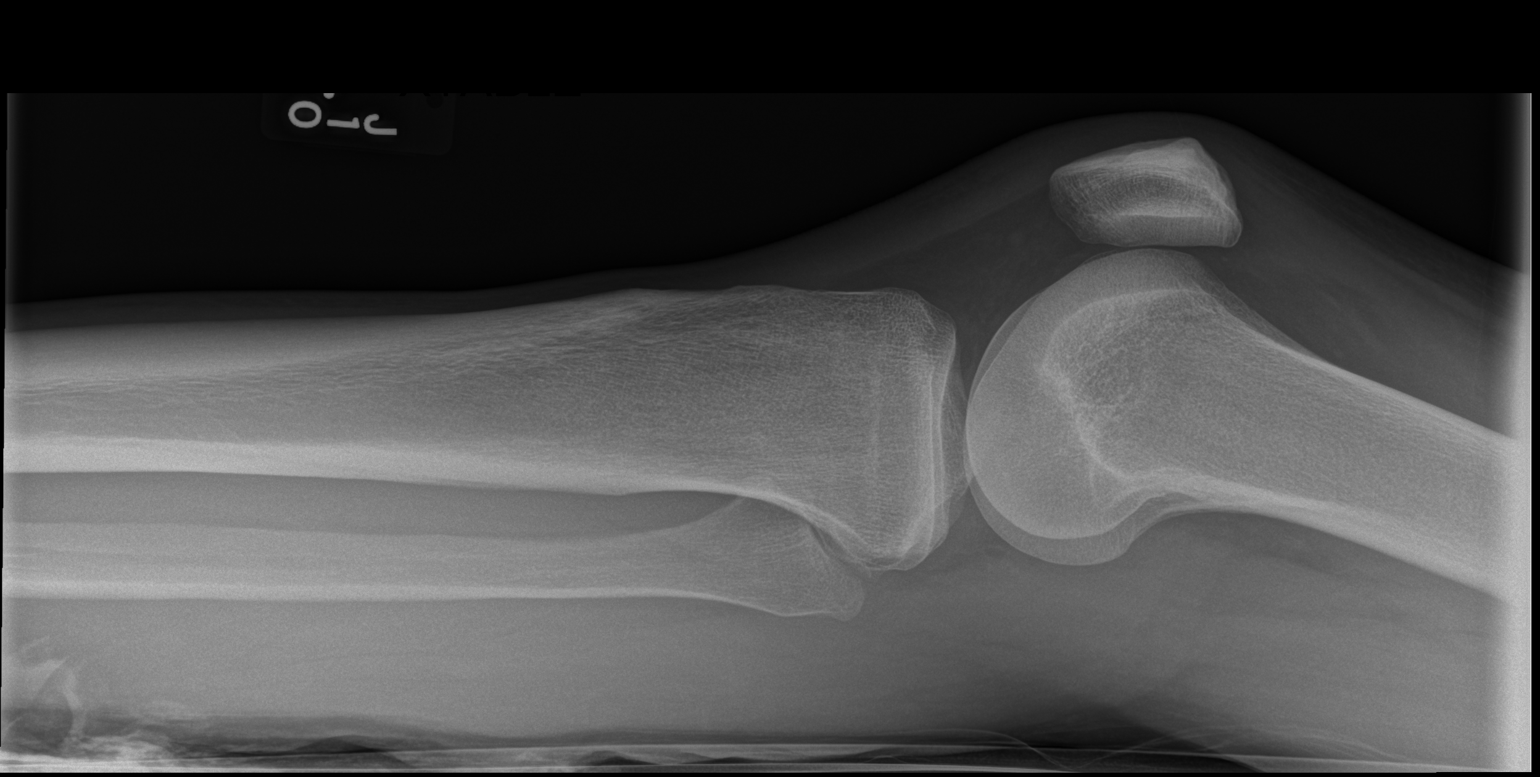

[4 of 4 positions shown; findings below may reference images not displayed]

FINDINGS: No fracture or dislocation. Extensive opacity from gauze wrapping
the lower extremity. Cannot exclude 7 mm at linear foreign body
posteriorly at the level of the junction of the middle and distal
thirds of the lower extremity.
IMPRESSION: Cannot exclude foreign body.

## 2016-02-22 ENCOUNTER — Encounter (HOSPITAL_COMMUNITY): Payer: Self-pay | Admitting: Emergency Medicine

## 2016-02-22 ENCOUNTER — Emergency Department (HOSPITAL_COMMUNITY)
Admission: EM | Admit: 2016-02-22 | Discharge: 2016-02-22 | Disposition: A | Discharge status: Home / Self Care | Payer: Self-pay | Attending: Emergency Medicine | Admitting: Emergency Medicine

## 2016-02-22 DIAGNOSIS — N189 Chronic kidney disease, unspecified: Secondary | ICD-10-CM | POA: Insufficient documentation

## 2016-02-22 DIAGNOSIS — G4700 Insomnia, unspecified: Secondary | ICD-10-CM | POA: Insufficient documentation

## 2016-02-22 DIAGNOSIS — F17200 Nicotine dependence, unspecified, uncomplicated: Secondary | ICD-10-CM | POA: Insufficient documentation

## 2016-02-22 DIAGNOSIS — Z7982 Long term (current) use of aspirin: Secondary | ICD-10-CM | POA: Insufficient documentation

## 2016-02-22 DIAGNOSIS — F419 Anxiety disorder, unspecified: Secondary | ICD-10-CM | POA: Insufficient documentation

## 2016-02-22 DIAGNOSIS — I129 Hypertensive chronic kidney disease with stage 1 through stage 4 chronic kidney disease, or unspecified chronic kidney disease: Secondary | ICD-10-CM | POA: Insufficient documentation

## 2016-02-22 NOTE — ED Provider Notes (Signed)
[facility]-EMERGENCY DEPT Provider Note   CSN: [id number] Arrival date & time: 02/22/16  4098  First Provider Contact:  None       History   Chief Complaint Chief Complaint  Patient presents with  . Anxiety  . Insomnia    HPI Harold Whitney is a 40 y.o. male. Pt comes in with cc of anxiety and HTN. Reports that he has been unable to sleep well the last 1 month due to anxiety. The last 3-4 days his symptoms have been the worst. Pt has tried all sorts of OTC meds w/o relief. He also was surprised to see his BP elevated in the ER. Denies chest pain, dib, vision complains. He denies psych hx. 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 No si. Hi. He used methadone for 4 years. He thinks withdrawals also possible. HPI  Past Medical History:  Diagnosis Date  . Chronic kidney disease   . Neuromuscular disorder [facility])     Patient Active Problem List   Diagnosis Date Noted  . Metabolic acidosis 02/13/2012  . Acute renal failure (HCC) 02/12/2012  . Occasional tremors 02/12/2012  . Hyponatremia 02/12/2012  . Rhabdomyolysis 02/12/2012    Past Surgical History:  Procedure Laterality Date  . JOINT REPLACEMENT         Home Medications    Prior to Admission medications   Medication Sig Start Date End Date Taking? Authorizing Provider  acetaminophen (TYLENOL) 500 MG tablet Take 1,000 mg by mouth every 4 (four) hours as needed for mild pain.    Historical Provider, MD  Aspirin-Salicylamide-Caffeine (BC HEADACHE POWDER PO) Take 1 packet by mouth 2 (two) times daily as needed (pain).    Historical Provider, MD  oxyCODONE (ROXICODONE) 5 MG immediate release tablet Take 1-2 tablets (5-10 mg total) by mouth every 4 (four) hours as needed for severe pain. 01/16/14   Marily Memos, MD    Family History History reviewed. No pertinent family history.  Social History Social History  Substance Use Topics  . Smoking status: Current  Every Day Smoker    Packs/day: 1.00    Years: 6.00  . Smokeless tobacco: Never Used  . Alcohol use No     Comment: socially     Allergies   Phenergan [promethazine hcl]   Review of Systems Review of Systems  Constitutional: Negative for activity change and appetite change.  Eyes: Negative for visual disturbance.  Respiratory: Negative for cough and shortness of breath.   Cardiovascular: Negative for chest pain.  Gastrointestinal: Negative for abdominal pain.  Genitourinary: Negative for dysuria.  Skin: Negative for rash.  Neurological: Negative for headaches.  Psychiatric/Behavioral: Positive for sleep disturbance. Negative for suicidal ideas.     Physical Exam Updated Vital Signs BP (!) 174/113 (BP Location: Right Arm)   Pulse 87   Temp 98.2 F (36.8 C) (Oral)   Resp 19   Ht  (1.778 m)   Wt 175 lb (79.4 kg)   SpO2 99%   BMI 25.11 kg/m   Physical Exam  Constitutional: He is oriented to person, place, and time. He appears well-developed.  HENT:  Head: Normocephalic and atraumatic.  Eyes: Conjunctivae and EOM are normal. Pupils are equal, round, and reactive to light.  Neck: Normal range of motion. Neck supple.  Cardiovascular: Normal rate and regular rhythm.   Pulmonary/Chest: Effort normal and breath sounds normal.  Abdominal: Soft. Bowel sounds are normal. He exhibits no  distension. There is no tenderness. There is no rebound and no guarding.  Neurological: He is alert and oriented to person, place, and time.  Skin: Skin is warm.  Psychiatric: He has a normal mood and affect. His behavior is normal. Thought content normal.     ED Treatments / Results  Labs (all labs ordered are listed, but only abnormal results are displayed) Labs Reviewed - No data to display  EKG  EKG Interpretation None       Radiology No results found.  Procedures Procedures (including critical care time)  Medications Ordered in ED Medications - No data to  display   Initial Impression / Assessment and Plan / ED Course  I have reviewed the triage vital signs and the nursing notes.  Pertinent labs & imaging results that were available during my care of the patient were reviewed by me and considered in my medical decision making (see chart for details).  Clinical Course    Pt with anxiety, insomonia and elevated BP. Asymptomatic with elevated BP - no imaging, labs indicated. PT also has insomnia, feeling anxious. No si, hi. Methadone withdrawals less likely month out - especially if he is stating that his symptoms of restlessness have gotten worse. We will refer him to [facility] for proper diagnosis and optimal care.  Final Clinical Impressions(s) / ED Diagnoses   Final diagnoses:  Insomnia  Anxiety    New Prescriptions Discharge Medication List as of 02/22/2016  6:33 AM       Derwood Kaplan, MD 02/22/16 0745
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
## 2016-04-02 ENCOUNTER — Encounter: Payer: Self-pay | Admitting: Emergency Medicine

## 2016-04-02 ENCOUNTER — Emergency Department
Admission: EM | Admit: 2016-04-02 | Discharge: 2016-04-02 | Disposition: A | Discharge status: Home / Self Care | Payer: Self-pay | Attending: Emergency Medicine | Admitting: Emergency Medicine

## 2016-04-02 ENCOUNTER — Emergency Department: Payer: Self-pay

## 2016-04-02 DIAGNOSIS — S0990XA Unspecified injury of head, initial encounter: Secondary | ICD-10-CM

## 2016-04-02 DIAGNOSIS — Y939 Activity, unspecified: Secondary | ICD-10-CM | POA: Insufficient documentation

## 2016-04-02 DIAGNOSIS — S161XXA Strain of muscle, fascia and tendon at neck level, initial encounter: Secondary | ICD-10-CM | POA: Insufficient documentation

## 2016-04-02 DIAGNOSIS — Z7982 Long term (current) use of aspirin: Secondary | ICD-10-CM | POA: Insufficient documentation

## 2016-04-02 DIAGNOSIS — S0101XA Laceration without foreign body of scalp, initial encounter: Secondary | ICD-10-CM | POA: Insufficient documentation

## 2016-04-02 DIAGNOSIS — Z79899 Other long term (current) drug therapy: Secondary | ICD-10-CM | POA: Insufficient documentation

## 2016-04-02 DIAGNOSIS — S39012A Strain of muscle, fascia and tendon of lower back, initial encounter: Secondary | ICD-10-CM | POA: Insufficient documentation

## 2016-04-02 DIAGNOSIS — Y929 Unspecified place or not applicable: Secondary | ICD-10-CM | POA: Insufficient documentation

## 2016-04-02 DIAGNOSIS — N189 Chronic kidney disease, unspecified: Secondary | ICD-10-CM | POA: Insufficient documentation

## 2016-04-02 DIAGNOSIS — W208XXA Other cause of strike by thrown, projected or falling object, initial encounter: Secondary | ICD-10-CM | POA: Insufficient documentation

## 2016-04-02 DIAGNOSIS — Y999 Unspecified external cause status: Secondary | ICD-10-CM | POA: Insufficient documentation

## 2016-04-02 MED ORDER — OXYCODONE-ACETAMINOPHEN 7.5-325 MG PO TABS: 1.0000 | ORAL_TABLET | ORAL | 0 refills | Status: DC | PRN

## 2016-04-02 MED ORDER — OXYCODONE-ACETAMINOPHEN 5-325 MG PO TABS
1.0000 | ORAL_TABLET | Freq: Once | ORAL | Status: AC
  Administered 2016-04-02: 1 via ORAL
  Filled 2016-04-02: qty 1

## 2016-04-02 MED ORDER — BUPIVACAINE HCL (PF) 0.25 % IJ SOLN
INTRAMUSCULAR | Status: AC
  Filled 2016-04-02: qty 30

## 2016-04-02 MED ORDER — HYDROMORPHONE HCL 1 MG/ML IJ SOLN
1.0000 mg | Freq: Once | INTRAMUSCULAR | Status: AC
  Administered 2016-04-02: 1 mg via INTRAMUSCULAR
  Filled 2016-04-02: qty 1

## 2016-04-02 MED ORDER — IBUPROFEN 600 MG PO TABS: 600.0000 mg | ORAL_TABLET | Freq: Three times a day (TID) | ORAL | 0 refills | Status: DC | PRN

## 2016-04-02 NOTE — Discharge Instructions (Signed)
Advised sedentary work for 5-7 days. Medication may cause drowsiness.

## 2016-04-02 NOTE — ED Notes (Signed)
Pt discharged home after verbalizing understanding of discharge instructions; nad noted. 

## 2016-04-02 NOTE — ED Provider Notes (Signed)
[facility] Emergency Department Provider Note   ____________________________________________   First MD Initiated Contact with Patient 04/02/16 1513     (approximate)  I have reviewed the triage vital signs and the nursing notes.   HISTORY  Chief Complaint Head Injury    HPI Harold Whitney is a 40 y.o. male patient complain of head and neck pain secondary to a large piece of lumber was dropped on top of his head.. Patient stated both to his scalp forced him to the ground and was disorientated without loss of consciousness. Patient stated his bleeding from a laceration large knot on the top of his head. Patient also complaining of neck pain is radiating into his upper extremities. Patient denies loss of sensation to the upper extremities. No palliative measures prior to arrival. Patient is rating his pain as 8/10. Patient denies any vertigo or vision disturbance.  Past Medical History:  Diagnosis Date  . Chronic kidney disease   . Neuromuscular disorder [facility])     Patient Active Problem List   Diagnosis Date Noted  . Metabolic acidosis 02/13/2012  . Acute renal failure (HCC) 02/12/2012  . Occasional tremors 02/12/2012  . Hyponatremia 02/12/2012  . Rhabdomyolysis 02/12/2012    Past Surgical History:  Procedure Laterality Date  . JOINT REPLACEMENT      Prior to Admission medications   Medication Sig Start Date End Date Taking? Authorizing Provider  acetaminophen (TYLENOL) 500 MG tablet Take 1,000 mg by mouth every 4 (four) hours as needed for mild pain.    Historical Provider, MD  Aspirin-Salicylamide-Caffeine (BC HEADACHE POWDER PO) Take 1 packet by mouth 2 (two) times daily as needed (pain).    Historical Provider, MD  oxyCODONE (ROXICODONE) 5 MG immediate release tablet Take 1-2 tablets (5-10 mg total) by mouth every 4 (four) hours as needed for severe pain. 01/16/14   Marily Memos, MD    Allergies Phenergan [promethazine hcl]  No family  history on file.  Social History Social History  Substance Use Topics  . Smoking status: Current Every Day Smoker    Packs/day: 1.00    Years: 6.00  . Smokeless tobacco: Never Used  . Alcohol use No     Comment: socially    Review of Systems Constitutional: No fever/chills Eyes: No visual changes. ENT: No sore throat. Cardiovascular: Denies chest pain. Respiratory: Denies shortness of breath. Gastrointestinal: No abdominal pain.  No nausea, no vomiting.  No diarrhea.  No constipation. Genitourinary: Negative for dysuria. Musculoskeletal: Neck pain  Skin: Negative for rash. Scalp laceration Neurological: Positive for headaches, but denies focal weakness or numbness. Allergic/Immunilogical: Phenergan ____________________________________________   PHYSICAL EXAM:  VITAL SIGNS: ED Triage Vitals [04/02/16 1459]  Enc Vitals Group     BP (!) 163/100     Pulse Rate (!) 108     Resp 20     Temp 98 F (36.7 C)     Temp Source Oral     SpO2 98 %     Weight 175 lb (79.4 kg)     Height 5\' 10"  (1.778 m)     Head Circumference      Peak Flow      Pain Score 8     Pain Loc      Pain Edu?      Excl. in GC?     Constitutional: Alert and oriented.Moderate distress Eyes: Conjunctivae are normal. PERRL. EOMI. Head: Hematoma and laceration superior aspect of the scalp. Nose: No congestion/rhinnorhea. Mouth/Throat: Mucous  membranes are moist.  Oropharynx non-erythematous. Neck: No stridor.   cervical spine tenderness to palpation. Hematological/Lymphatic/Immunilogical: No cervical lymphadenopathy. Cardiovascular: Tachycardic Grossly normal heart sounds.  Good peripheral circulation. Elevated blood pressure Respiratory: Normal respiratory effort.  No retractions. Lungs CTAB. Gastrointestinal: Soft and nontender. No distention. No abdominal bruits. No CVA tenderness. Musculoskeletal: No lower extremity tenderness nor edema.  No joint effusions. Neurologic:  Normal speech and  language. No gross focal neurologic deficits are appreciated. No gait instability. Skin:  Skin is warm, dry and intact. No rash noted. Psychiatric: Mood and affect are normal. Speech and behavior are normal.  ____________________________________________   LABS (all labs ordered are listed, but only abnormal results are displayed)  Labs Reviewed - No data to display ____________________________________________  EKG   ____________________________________________  RADIOLOGY   ____________________________________________   PROCEDURES  Procedure(s) performed: LACERATION REPAIR Performed by: Joni Reiningonald K Delno Blaisdell Authorized by: Joni Reiningonald K Korrine Sicard Consent: Verbal consent obtained. Risks and benefits: risks, benefits and alternatives were discussed Consent given by: patient Patient identity confirmed: provided demographic data Prepped and Draped in normal sterile fashion Wound explored  Laceration Location: Scalp  Laceration Length:4cm  No Foreign Bodies seen or palpated  Anesthesia: local infiltration  Local anesthetic:0.25% Bupivacaine  Anesthetic total: 6ml  Irrigation method: syringe Amount of cleaning: standard  Skin closure: Staples Number of staples: 9 Patient tolerance: Patient tolerated the procedure well with no immediate complications.   Procedures  Critical Care performed: No  ____________________________________________   INITIAL IMPRESSION / ASSESSMENT AND PLAN / ED COURSE  Pertinent labs & imaging results that were available during my care of the patient were reviewed by me and considered in my medical decision making (see chart for details).  Scalp laceration, scalp hematoma, and neck pain. Patient given discharge care instructions. Patient advised return back in 10 days for staple removal. Patient given work restrictions. Patient given a prescription for Percocets and ibuprofen.  Clinical Course  Throughout patient's stay was never able to get a  good local block in the scalp. Patient had 9 staples placed in the scalp.  was unable to clean the patient secondary to his continued complaint of pain.   ____________________________________________   FINAL CLINICAL IMPRESSION(S) / ED DIAGNOSES  Final diagnoses:  Scalp laceration, initial encounter  Cervical strain, acute, initial encounter  Lumbar strain, initial encounter  Minor head injury, initial encounter      NEW MEDICATIONS STARTED DURING THIS VISIT:  New Prescriptions   No medications on file     Note:  This document was prepared using Dragon voice recognition software and may include unintentional dictation errors.    Joni ReiningRonald K Anahit Klumb, PA-C 04/02/16 1840    Loleta Roseory Forbach, MD 04/02/16 2122

## 2016-04-02 NOTE — ED Triage Notes (Signed)
States he had a piece of lumber drop on to top of head   Laceration noted to scalp  Having pain to neck

## 2023-11-18 DEATH — deceased
